# Patient Record
Sex: Female | Born: 1945 | Race: White | Hispanic: No | Marital: Married | Smoking: Never smoker
Health system: Southern US, Community
[De-identification: ages and names within clinical notes are randomized; demographics above are authoritative.]

## PROBLEM LIST (undated history)

## (undated) DIAGNOSIS — T884XXA Failed or difficult intubation, initial encounter: Secondary | ICD-10-CM

## (undated) DIAGNOSIS — K449 Diaphragmatic hernia without obstruction or gangrene: Secondary | ICD-10-CM

## (undated) DIAGNOSIS — E782 Mixed hyperlipidemia: Secondary | ICD-10-CM

## (undated) DIAGNOSIS — R55 Syncope and collapse: Secondary | ICD-10-CM

## (undated) DIAGNOSIS — H9190 Unspecified hearing loss, unspecified ear: Secondary | ICD-10-CM

## (undated) DIAGNOSIS — E538 Deficiency of other specified B group vitamins: Secondary | ICD-10-CM

## (undated) DIAGNOSIS — D649 Anemia, unspecified: Secondary | ICD-10-CM

## (undated) DIAGNOSIS — I639 Cerebral infarction, unspecified: Secondary | ICD-10-CM

## (undated) DIAGNOSIS — K589 Irritable bowel syndrome without diarrhea: Secondary | ICD-10-CM

## (undated) DIAGNOSIS — M1990 Unspecified osteoarthritis, unspecified site: Secondary | ICD-10-CM

## (undated) DIAGNOSIS — I38 Endocarditis, valve unspecified: Secondary | ICD-10-CM

## (undated) DIAGNOSIS — E119 Type 2 diabetes mellitus without complications: Secondary | ICD-10-CM

## (undated) DIAGNOSIS — I340 Nonrheumatic mitral (valve) insufficiency: Secondary | ICD-10-CM

## (undated) DIAGNOSIS — I517 Cardiomegaly: Secondary | ICD-10-CM

## (undated) DIAGNOSIS — I10 Essential (primary) hypertension: Secondary | ICD-10-CM

## (undated) DIAGNOSIS — K219 Gastro-esophageal reflux disease without esophagitis: Secondary | ICD-10-CM

## (undated) DIAGNOSIS — H3530 Unspecified macular degeneration: Secondary | ICD-10-CM

## (undated) DIAGNOSIS — G629 Polyneuropathy, unspecified: Secondary | ICD-10-CM

## (undated) DIAGNOSIS — R197 Diarrhea, unspecified: Secondary | ICD-10-CM

## (undated) DIAGNOSIS — E049 Nontoxic goiter, unspecified: Secondary | ICD-10-CM

## (undated) DIAGNOSIS — K121 Other forms of stomatitis: Secondary | ICD-10-CM

## (undated) DIAGNOSIS — G5600 Carpal tunnel syndrome, unspecified upper limb: Secondary | ICD-10-CM

## (undated) HISTORY — PX: TONSILLECTOMY: SUR1361

## (undated) HISTORY — PX: OTHER SURGICAL HISTORY: SHX169

## (undated) HISTORY — PX: ABDOMINAL HYSTERECTOMY: SHX81

## (undated) HISTORY — PX: JOINT REPLACEMENT: SHX530

## (undated) HISTORY — PX: APPENDECTOMY: SHX54

## (undated) HISTORY — PX: THYROIDECTOMY: SHX17

## (undated) HISTORY — PX: EYE SURGERY: SHX253

## (undated) HISTORY — PX: CATARACT EXTRACTION: SUR2

## (undated) HISTORY — PX: SHOULDER SURGERY: SHX246

---

## 2005-01-19 ENCOUNTER — Ambulatory Visit: Payer: Self-pay | Admitting: Family Medicine

## 2005-08-20 ENCOUNTER — Ambulatory Visit: Payer: Self-pay | Admitting: Unknown Physician Specialty

## 2006-02-01 ENCOUNTER — Ambulatory Visit: Payer: Self-pay | Admitting: Unknown Physician Specialty

## 2006-05-23 ENCOUNTER — Ambulatory Visit: Payer: Self-pay

## 2006-06-13 ENCOUNTER — Ambulatory Visit: Payer: Self-pay | Admitting: Unknown Physician Specialty

## 2006-08-15 ENCOUNTER — Ambulatory Visit: Payer: Self-pay | Admitting: General Practice

## 2006-09-26 ENCOUNTER — Ambulatory Visit: Payer: Self-pay | Admitting: General Practice

## 2007-02-08 ENCOUNTER — Ambulatory Visit: Payer: Self-pay | Admitting: Unknown Physician Specialty

## 2007-07-27 ENCOUNTER — Ambulatory Visit: Payer: Self-pay | Admitting: General Practice

## 2007-09-12 ENCOUNTER — Ambulatory Visit: Payer: Self-pay | Admitting: General Practice

## 2007-09-12 ENCOUNTER — Other Ambulatory Visit: Payer: Self-pay

## 2007-09-18 ENCOUNTER — Ambulatory Visit: Payer: Self-pay | Admitting: General Practice

## 2007-11-30 HISTORY — PX: OTHER SURGICAL HISTORY: SHX169

## 2007-12-25 ENCOUNTER — Ambulatory Visit: Payer: Self-pay | Admitting: General Practice

## 2008-01-08 ENCOUNTER — Inpatient Hospital Stay: Payer: Self-pay | Admitting: General Practice

## 2008-02-27 ENCOUNTER — Ambulatory Visit: Payer: Self-pay | Admitting: Unknown Physician Specialty

## 2008-07-03 ENCOUNTER — Ambulatory Visit: Payer: Self-pay | Admitting: Unknown Physician Specialty

## 2009-03-03 ENCOUNTER — Ambulatory Visit: Payer: Self-pay | Admitting: Unknown Physician Specialty

## 2009-07-23 ENCOUNTER — Ambulatory Visit: Payer: Self-pay | Admitting: Unknown Physician Specialty

## 2009-12-26 ENCOUNTER — Ambulatory Visit: Payer: Self-pay | Admitting: Unknown Physician Specialty

## 2010-03-05 ENCOUNTER — Ambulatory Visit: Payer: Self-pay | Admitting: Unknown Physician Specialty

## 2011-01-27 ENCOUNTER — Ambulatory Visit: Payer: Self-pay | Admitting: General Practice

## 2011-03-08 ENCOUNTER — Ambulatory Visit: Payer: Self-pay | Admitting: Unknown Physician Specialty

## 2011-06-17 ENCOUNTER — Emergency Department: Payer: Self-pay | Admitting: Emergency Medicine

## 2012-03-29 ENCOUNTER — Ambulatory Visit: Payer: Self-pay | Admitting: Unknown Physician Specialty

## 2013-04-11 ENCOUNTER — Ambulatory Visit: Payer: Self-pay | Admitting: Unknown Physician Specialty

## 2014-05-21 ENCOUNTER — Ambulatory Visit: Payer: Self-pay | Admitting: Physician Assistant

## 2014-09-01 ENCOUNTER — Emergency Department: Payer: Self-pay | Admitting: Internal Medicine

## 2014-09-01 LAB — BASIC METABOLIC PANEL
ANION GAP: 11 (ref 7–16)
BUN: 18 mg/dL (ref 7–18)
CO2: 25 mmol/L (ref 21–32)
Calcium, Total: 8.9 mg/dL (ref 8.5–10.1)
Chloride: 97 mmol/L — ABNORMAL LOW (ref 98–107)
Creatinine: 0.91 mg/dL (ref 0.60–1.30)
EGFR (African American): >60
Glucose: 178 mg/dL — ABNORMAL HIGH (ref 65–99)
OSMOLALITY: 273 (ref 275–301)
Potassium: 3.9 mmol/L (ref 3.5–5.1)
Sodium: 133 mmol/L — ABNORMAL LOW (ref 136–145)

## 2014-09-01 LAB — CBC
HCT: 38.2 % (ref 35.0–47.0)
HGB: 12.7 g/dL (ref 12.0–16.0)
MCH: 29.6 pg (ref 26.0–34.0)
MCHC: 33.4 g/dL (ref 32.0–36.0)
MCV: 89 fL (ref 80–100)
Platelet: 304 10*3/uL (ref 150–440)
RBC: 4.3 10*6/uL (ref 3.80–5.20)
RDW: 13.8 % (ref 11.5–14.5)
WBC: 7.2 10*3/uL (ref 3.6–11.0)

## 2014-09-01 LAB — TROPONIN I

## 2015-09-18 ENCOUNTER — Other Ambulatory Visit: Payer: Self-pay | Admitting: Neurology

## 2015-09-18 DIAGNOSIS — R42 Dizziness and giddiness: Secondary | ICD-10-CM

## 2015-09-18 DIAGNOSIS — R55 Syncope and collapse: Secondary | ICD-10-CM

## 2015-09-29 ENCOUNTER — Ambulatory Visit: Payer: Commercial Managed Care - HMO

## 2015-10-16 ENCOUNTER — Ambulatory Visit
Admission: RE | Admit: 2015-10-16 | Discharge: 2015-10-16 | Disposition: A | Discharge status: Home / Self Care | Payer: Commercial Managed Care - HMO | Source: Ambulatory Visit | Attending: Neurology | Admitting: Neurology

## 2015-10-16 DIAGNOSIS — G319 Degenerative disease of nervous system, unspecified: Secondary | ICD-10-CM | POA: Insufficient documentation

## 2015-10-16 DIAGNOSIS — R55 Syncope and collapse: Secondary | ICD-10-CM | POA: Diagnosis present

## 2015-10-16 DIAGNOSIS — R42 Dizziness and giddiness: Secondary | ICD-10-CM | POA: Diagnosis present

## 2015-10-16 DIAGNOSIS — I639 Cerebral infarction, unspecified: Secondary | ICD-10-CM | POA: Diagnosis not present

## 2015-10-16 MED ORDER — GADOBENATE DIMEGLUMINE 529 MG/ML IV SOLN
20.0000 mL | Freq: Once | INTRAVENOUS | Status: AC | PRN
  Administered 2015-10-16: 16 mL via INTRAVENOUS

## 2015-11-11 ENCOUNTER — Other Ambulatory Visit: Payer: Self-pay | Admitting: Physician Assistant

## 2015-11-11 DIAGNOSIS — Z1231 Encounter for screening mammogram for malignant neoplasm of breast: Secondary | ICD-10-CM

## 2015-11-19 ENCOUNTER — Other Ambulatory Visit: Payer: Self-pay | Admitting: Physician Assistant

## 2015-11-19 ENCOUNTER — Ambulatory Visit
Admission: RE | Admit: 2015-11-19 | Discharge: 2015-11-19 | Disposition: A | Discharge status: Home / Self Care | Payer: Commercial Managed Care - HMO | Source: Ambulatory Visit | Attending: Physician Assistant | Admitting: Physician Assistant

## 2015-11-19 DIAGNOSIS — Z1231 Encounter for screening mammogram for malignant neoplasm of breast: Secondary | ICD-10-CM | POA: Diagnosis not present

## 2015-12-16 DIAGNOSIS — Z1211 Encounter for screening for malignant neoplasm of colon: Secondary | ICD-10-CM | POA: Diagnosis not present

## 2015-12-19 DIAGNOSIS — Z1211 Encounter for screening for malignant neoplasm of colon: Secondary | ICD-10-CM | POA: Diagnosis not present

## 2015-12-19 DIAGNOSIS — Z1212 Encounter for screening for malignant neoplasm of rectum: Secondary | ICD-10-CM | POA: Diagnosis not present

## 2015-12-25 DIAGNOSIS — G8929 Other chronic pain: Secondary | ICD-10-CM | POA: Diagnosis not present

## 2015-12-25 DIAGNOSIS — M1711 Unilateral primary osteoarthritis, right knee: Secondary | ICD-10-CM | POA: Diagnosis not present

## 2015-12-25 DIAGNOSIS — M25561 Pain in right knee: Secondary | ICD-10-CM | POA: Diagnosis not present

## 2015-12-25 DIAGNOSIS — Z96652 Presence of left artificial knee joint: Secondary | ICD-10-CM | POA: Diagnosis not present

## 2015-12-30 DIAGNOSIS — E538 Deficiency of other specified B group vitamins: Secondary | ICD-10-CM | POA: Diagnosis not present

## 2015-12-30 DIAGNOSIS — D510 Vitamin B12 deficiency anemia due to intrinsic factor deficiency: Secondary | ICD-10-CM | POA: Diagnosis not present

## 2016-01-07 DIAGNOSIS — G464 Cerebellar stroke syndrome: Secondary | ICD-10-CM | POA: Diagnosis not present

## 2016-01-07 DIAGNOSIS — G319 Degenerative disease of nervous system, unspecified: Secondary | ICD-10-CM | POA: Diagnosis not present

## 2016-01-07 DIAGNOSIS — R42 Dizziness and giddiness: Secondary | ICD-10-CM | POA: Diagnosis not present

## 2016-01-08 ENCOUNTER — Other Ambulatory Visit: Payer: Self-pay

## 2016-01-08 ENCOUNTER — Encounter
Admission: RE | Admit: 2016-01-08 | Discharge: 2016-01-08 | Disposition: A | Discharge status: Home / Self Care | Payer: PPO | Source: Ambulatory Visit | Attending: Orthopedic Surgery | Admitting: Orthopedic Surgery

## 2016-01-08 DIAGNOSIS — G8929 Other chronic pain: Secondary | ICD-10-CM | POA: Diagnosis not present

## 2016-01-08 DIAGNOSIS — Z7984 Long term (current) use of oral hypoglycemic drugs: Secondary | ICD-10-CM | POA: Diagnosis not present

## 2016-01-08 DIAGNOSIS — Z7982 Long term (current) use of aspirin: Secondary | ICD-10-CM | POA: Diagnosis not present

## 2016-01-08 DIAGNOSIS — M1711 Unilateral primary osteoarthritis, right knee: Secondary | ICD-10-CM | POA: Diagnosis not present

## 2016-01-08 DIAGNOSIS — Z01812 Encounter for preprocedural laboratory examination: Secondary | ICD-10-CM | POA: Diagnosis not present

## 2016-01-08 DIAGNOSIS — M25561 Pain in right knee: Secondary | ICD-10-CM | POA: Insufficient documentation

## 2016-01-08 DIAGNOSIS — Z79899 Other long term (current) drug therapy: Secondary | ICD-10-CM | POA: Diagnosis not present

## 2016-01-08 DIAGNOSIS — H353211 Exudative age-related macular degeneration, right eye, with active choroidal neovascularization: Secondary | ICD-10-CM | POA: Diagnosis not present

## 2016-01-08 DIAGNOSIS — Z96651 Presence of right artificial knee joint: Secondary | ICD-10-CM | POA: Diagnosis not present

## 2016-01-08 DIAGNOSIS — Z471 Aftercare following joint replacement surgery: Secondary | ICD-10-CM | POA: Diagnosis not present

## 2016-01-08 DIAGNOSIS — E119 Type 2 diabetes mellitus without complications: Secondary | ICD-10-CM | POA: Diagnosis not present

## 2016-01-08 DIAGNOSIS — H353122 Nonexudative age-related macular degeneration, left eye, intermediate dry stage: Secondary | ICD-10-CM | POA: Diagnosis not present

## 2016-01-08 DIAGNOSIS — M179 Osteoarthritis of knee, unspecified: Secondary | ICD-10-CM | POA: Diagnosis not present

## 2016-01-08 DIAGNOSIS — D519 Vitamin B12 deficiency anemia, unspecified: Secondary | ICD-10-CM | POA: Diagnosis not present

## 2016-01-08 DIAGNOSIS — I10 Essential (primary) hypertension: Secondary | ICD-10-CM | POA: Diagnosis not present

## 2016-01-08 HISTORY — DX: Failed or difficult intubation, initial encounter: T88.4XXA

## 2016-01-08 LAB — CBC
HCT: 33.4 % — ABNORMAL LOW (ref 35.0–47.0)
Hemoglobin: 11.1 g/dL — ABNORMAL LOW (ref 12.0–16.0)
MCH: 26.5 pg (ref 26.0–34.0)
MCHC: 33.3 g/dL (ref 32.0–36.0)
MCV: 79.6 fL — ABNORMAL LOW (ref 80.0–100.0)
PLATELETS: 345 10*3/uL (ref 150–440)
RBC: 4.2 MIL/uL (ref 3.80–5.20)
RDW: 14.8 % — AB (ref 11.5–14.5)
WBC: 6.8 10*3/uL (ref 3.6–11.0)

## 2016-01-08 LAB — HEMOGLOBIN A1C: Hgb A1c MFr Bld: 6.8 % — ABNORMAL HIGH (ref 4.0–6.0)

## 2016-01-08 LAB — URINALYSIS COMPLETE WITH MICROSCOPIC (ARMC ONLY)
Bilirubin Urine: NEGATIVE
GLUCOSE, UA: NEGATIVE mg/dL
HGB URINE DIPSTICK: NEGATIVE
Ketones, ur: NEGATIVE mg/dL
LEUKOCYTES UA: NEGATIVE
NITRITE: NEGATIVE
PH: 6 (ref 5.0–8.0)
PROTEIN: NEGATIVE mg/dL
SPECIFIC GRAVITY, URINE: 1.011 (ref 1.005–1.030)
WBC UA: NONE SEEN WBC/hpf (ref 0–5)

## 2016-01-08 LAB — BASIC METABOLIC PANEL
ANION GAP: 13 (ref 5–15)
BUN: 22 mg/dL — ABNORMAL HIGH (ref 6–20)
CALCIUM: 10 mg/dL (ref 8.9–10.3)
CO2: 26 mmol/L (ref 22–32)
Chloride: 95 mmol/L — ABNORMAL LOW (ref 101–111)
Creatinine, Ser: 0.66 mg/dL (ref 0.44–1.00)
Glucose, Bld: 107 mg/dL — ABNORMAL HIGH (ref 65–99)
POTASSIUM: 4 mmol/L (ref 3.5–5.1)
Sodium: 134 mmol/L — ABNORMAL LOW (ref 135–145)

## 2016-01-08 LAB — TYPE AND SCREEN
ABO/RH(D): B POS
ANTIBODY SCREEN: NEGATIVE

## 2016-01-08 LAB — SURGICAL PCR SCREEN
MRSA, PCR: NEGATIVE
Staphylococcus aureus: POSITIVE — AB

## 2016-01-08 LAB — PROTIME-INR
INR: 0.99
Prothrombin Time: 13.3 seconds (ref 11.4–15.0)

## 2016-01-08 LAB — ABO/RH: ABO/RH(D): B POS

## 2016-01-08 LAB — SEDIMENTATION RATE: Sed Rate: 14 mm/hr (ref 0–30)

## 2016-01-08 LAB — APTT: aPTT: 29 seconds (ref 24–36)

## 2016-01-08 NOTE — Patient Instructions (Addendum)
  Your procedure is scheduled on: 01/19/16 Report to Day Surgery. To find out your arrival time please call [phone] between 1PM - 3PM on 01/16/16  Remember: Instructions that are not followed completely may result in serious medical risk, up to and including death, or upon the discretion of your surgeon and anesthesiologist your surgery may need to be rescheduled.    ____ 1. Do not eat food or drink liquids after midnight. No gum chewing or hard candies.     ____ 2. No Alcohol for 24 hours before or after surgery.   ____ 3. Bring all medications with you on the day of surgery if instructed.    ____ 4. Notify your doctor if there is any change in your medical condition     (cold, fever, infections).     Do not wear jewelry, make-up, hairpins, clips or nail polish.  Do not wear lotions, powders, or perfumes. You may wear deodorant.  Do not shave 48 hours prior to surgery. Men may shave face and neck.  Do not bring valuables to the hospital.    [facility] is not responsible for any belongings or valuables.               Contacts, dentures or bridgework may not be worn into surgery.  Leave your suitcase in the car. After surgery it may be brought to your room.  For patients admitted to the hospital, discharge time is determined by your                treatment team.   Patients discharged the day of surgery will not be allowed to drive home.   Please read over the following fact sheets that you were given:   MRSA Information and Surgical Site Infection Prevention   __x__ Take these medicines the morning of surgery with A SIP OF WATER:    1. Omeprazole the night before surgery and morning of surgery                 lisinopril  2.   3.   4.  5.  6.  ____ Fleet Enema (as directed)   __x__ Use CHG Soap as directed  ____ Use inhalers on the day of surgery  _x___ Stop metformin 2 days prior to surgery 2/18    ____ Take 1/2 of usual insulin dose the night before surgery and  none on the morning of surgery.   ____ Stop aspirin on 2/13  ____ Stop Anti-inflammatories on 01/12/16 May take Tylenol only   __x__ Stop supplements until after surgery.  Vit E, Vit C, and Biotin,PreserVision, ( May take multi vitamin and Calcium)  ____ Bring C-Pap to the hospital.

## 2016-01-09 NOTE — Pre-Procedure Instructions (Signed)
Lab (met B, CBC, A1C, urine, and positive staph aureus) results sent to Anesthesia and Dr. Marry Guan for review.  Asked Dr. Marry Guan if wanted any treatment for positive staph aureus.

## 2016-01-10 LAB — URINE CULTURE: Special Requests: NORMAL

## 2016-01-13 NOTE — Pre-Procedure Instructions (Signed)
EKG NOTED. 

## 2016-01-13 NOTE — Pre-Procedure Instructions (Signed)
EKG sent to Anesthesia and Dr. Marry Guan for review.

## 2016-01-15 DIAGNOSIS — H353122 Nonexudative age-related macular degeneration, left eye, intermediate dry stage: Secondary | ICD-10-CM | POA: Diagnosis not present

## 2016-01-15 DIAGNOSIS — H353211 Exudative age-related macular degeneration, right eye, with active choroidal neovascularization: Secondary | ICD-10-CM | POA: Diagnosis not present

## 2016-01-19 ENCOUNTER — Encounter: Payer: Self-pay | Admitting: Orthopedic Surgery

## 2016-01-19 ENCOUNTER — Inpatient Hospital Stay
Admission: RE | Admit: 2016-01-19 | Discharge: 2016-01-21 | DRG: 470 | Disposition: A | Discharge status: Home Health | Payer: PPO | Source: Ambulatory Visit | Attending: Orthopedic Surgery | Admitting: Orthopedic Surgery

## 2016-01-19 ENCOUNTER — Inpatient Hospital Stay: Payer: PPO

## 2016-01-19 ENCOUNTER — Inpatient Hospital Stay: Payer: PPO | Admitting: Anesthesiology

## 2016-01-19 ENCOUNTER — Encounter
Admission: RE | Disposition: A | Discharge status: Home Health | Payer: Self-pay | Source: Ambulatory Visit | Attending: Orthopedic Surgery

## 2016-01-19 DIAGNOSIS — G8929 Other chronic pain: Secondary | ICD-10-CM | POA: Diagnosis not present

## 2016-01-19 DIAGNOSIS — Z79899 Other long term (current) drug therapy: Secondary | ICD-10-CM

## 2016-01-19 DIAGNOSIS — M1711 Unilateral primary osteoarthritis, right knee: Principal | ICD-10-CM | POA: Diagnosis present

## 2016-01-19 DIAGNOSIS — Z471 Aftercare following joint replacement surgery: Secondary | ICD-10-CM | POA: Diagnosis not present

## 2016-01-19 DIAGNOSIS — E119 Type 2 diabetes mellitus without complications: Secondary | ICD-10-CM | POA: Diagnosis present

## 2016-01-19 DIAGNOSIS — I10 Essential (primary) hypertension: Secondary | ICD-10-CM | POA: Diagnosis not present

## 2016-01-19 DIAGNOSIS — D519 Vitamin B12 deficiency anemia, unspecified: Secondary | ICD-10-CM | POA: Diagnosis present

## 2016-01-19 DIAGNOSIS — H353122 Nonexudative age-related macular degeneration, left eye, intermediate dry stage: Secondary | ICD-10-CM | POA: Diagnosis not present

## 2016-01-19 DIAGNOSIS — Z96651 Presence of right artificial knee joint: Secondary | ICD-10-CM | POA: Diagnosis not present

## 2016-01-19 DIAGNOSIS — Z7984 Long term (current) use of oral hypoglycemic drugs: Secondary | ICD-10-CM

## 2016-01-19 DIAGNOSIS — M25561 Pain in right knee: Secondary | ICD-10-CM | POA: Diagnosis not present

## 2016-01-19 DIAGNOSIS — Z7982 Long term (current) use of aspirin: Secondary | ICD-10-CM

## 2016-01-19 DIAGNOSIS — H353211 Exudative age-related macular degeneration, right eye, with active choroidal neovascularization: Secondary | ICD-10-CM | POA: Diagnosis not present

## 2016-01-19 DIAGNOSIS — Z01812 Encounter for preprocedural laboratory examination: Secondary | ICD-10-CM | POA: Diagnosis not present

## 2016-01-19 DIAGNOSIS — M179 Osteoarthritis of knee, unspecified: Secondary | ICD-10-CM | POA: Diagnosis not present

## 2016-01-19 DIAGNOSIS — Z96659 Presence of unspecified artificial knee joint: Secondary | ICD-10-CM

## 2016-01-19 HISTORY — DX: Type 2 diabetes mellitus without complications: E11.9

## 2016-01-19 HISTORY — PX: KNEE ARTHROPLASTY: SHX992

## 2016-01-19 LAB — GLUCOSE, CAPILLARY
Glucose-Capillary: 170 mg/dL — ABNORMAL HIGH (ref 65–99)
Glucose-Capillary: 170 mg/dL — ABNORMAL HIGH (ref 65–99)

## 2016-01-19 SURGERY — ARTHROPLASTY, KNEE, TOTAL, USING IMAGELESS COMPUTER-ASSISTED NAVIGATION
Anesthesia: Spinal | Laterality: Right | Wound class: Clean

## 2016-01-19 MED ORDER — BUPIVACAINE-EPINEPHRINE (PF) 0.25% -1:200000 IJ SOLN
INTRAMUSCULAR | Status: AC
  Filled 2016-01-19: qty 30

## 2016-01-19 MED ORDER — FENTANYL CITRATE (PF) 100 MCG/2ML IJ SOLN
INTRAMUSCULAR | Status: AC
  Administered 2016-01-19: 25 ug via INTRAVENOUS
  Filled 2016-01-19: qty 2

## 2016-01-19 MED ORDER — ONDANSETRON HCL 4 MG/2ML IJ SOLN
INTRAMUSCULAR | Status: AC
  Administered 2016-01-19: 4 mg via INTRAVENOUS
  Filled 2016-01-19: qty 2

## 2016-01-19 MED ORDER — NEOMYCIN-POLYMYXIN B GU 40-200000 IR SOLN
Status: AC
  Filled 2016-01-19: qty 20

## 2016-01-19 MED ORDER — BISACODYL 10 MG RE SUPP
10.0000 mg | Freq: Every day | RECTAL | Status: DC | PRN
  Administered 2016-01-21: 10 mg via RECTAL
  Filled 2016-01-19: qty 1

## 2016-01-19 MED ORDER — BUPIVACAINE IN DEXTROSE 0.75-8.25 % IT SOLN
INTRATHECAL | Status: DC | PRN
  Administered 2016-01-19: 1.7 mL via INTRATHECAL

## 2016-01-19 MED ORDER — SODIUM CHLORIDE 0.9 % IJ SOLN
INTRAMUSCULAR | Status: AC
  Filled 2016-01-19: qty 50

## 2016-01-19 MED ORDER — ADULT MULTIVITAMIN W/MINERALS CH
1.0000 | ORAL_TABLET | Freq: Once | ORAL | Status: DC
  Filled 2016-01-19: qty 1

## 2016-01-19 MED ORDER — ENOXAPARIN SODIUM 30 MG/0.3ML ~~LOC~~ SOLN
30.0000 mg | Freq: Two times a day (BID) | SUBCUTANEOUS | Status: DC
  Administered 2016-01-20 – 2016-01-21 (×3): 30 mg via SUBCUTANEOUS
  Filled 2016-01-19 (×3): qty 0.3

## 2016-01-19 MED ORDER — SODIUM CHLORIDE 0.9 % IV SOLN
10000.0000 ug | INTRAVENOUS | Status: DC | PRN
  Administered 2016-01-19: 10 ug/min via INTRAVENOUS

## 2016-01-19 MED ORDER — SODIUM CHLORIDE 0.9 % IV SOLN
INTRAVENOUS | Status: DC
  Administered 2016-01-19: 10:00:00 via INTRAVENOUS

## 2016-01-19 MED ORDER — TETRACAINE HCL 1 % IJ SOLN
INTRAMUSCULAR | Status: AC
  Filled 2016-01-19: qty 2

## 2016-01-19 MED ORDER — CLINDAMYCIN PHOSPHATE 900 MG/50ML IV SOLN: 900.0000 mg | Freq: Once | INTRAVENOUS | Status: DC

## 2016-01-19 MED ORDER — METOCLOPRAMIDE HCL 10 MG PO TABS
10.0000 mg | ORAL_TABLET | Freq: Three times a day (TID) | ORAL | Status: DC
  Administered 2016-01-19 – 2016-01-21 (×7): 10 mg via ORAL
  Filled 2016-01-19 (×6): qty 1

## 2016-01-19 MED ORDER — MAGNESIUM HYDROXIDE 400 MG/5ML PO SUSP
30.0000 mL | Freq: Every day | ORAL | Status: DC | PRN
  Administered 2016-01-21: 30 mL via ORAL
  Filled 2016-01-19: qty 30

## 2016-01-19 MED ORDER — TRANEXAMIC ACID 1000 MG/10ML IV SOLN
1000.0000 mg | Freq: Once | INTRAVENOUS | Status: AC
  Administered 2016-01-19: 1000 mg via INTRAVENOUS
  Filled 2016-01-19: qty 10

## 2016-01-19 MED ORDER — DIPHENHYDRAMINE HCL 12.5 MG/5ML PO ELIX: 12.5000 mg | ORAL_SOLUTION | ORAL | Status: DC | PRN

## 2016-01-19 MED ORDER — FLEET ENEMA 7-19 GM/118ML RE ENEM: 1.0000 | ENEMA | Freq: Once | RECTAL | Status: DC | PRN

## 2016-01-19 MED ORDER — ACETAMINOPHEN 10 MG/ML IV SOLN
1000.0000 mg | Freq: Four times a day (QID) | INTRAVENOUS | Status: AC
  Administered 2016-01-19 – 2016-01-20 (×3): 1000 mg via INTRAVENOUS
  Filled 2016-01-19 (×4): qty 100

## 2016-01-19 MED ORDER — KETAMINE HCL 10 MG/ML IJ SOLN
INTRAMUSCULAR | Status: DC | PRN
  Administered 2016-01-19 (×5): 10 mg via INTRAVENOUS

## 2016-01-19 MED ORDER — ACETAMINOPHEN 10 MG/ML IV SOLN
INTRAVENOUS | Status: DC | PRN
  Administered 2016-01-19: 1000 mg via INTRAVENOUS

## 2016-01-19 MED ORDER — ONDANSETRON HCL 4 MG/2ML IJ SOLN
4.0000 mg | Freq: Once | INTRAMUSCULAR | Status: AC | PRN
Start: 2016-01-19 — End: 2016-01-19
  Administered 2016-01-19: 4 mg via INTRAVENOUS

## 2016-01-19 MED ORDER — PANTOPRAZOLE SODIUM 40 MG PO TBEC
40.0000 mg | DELAYED_RELEASE_TABLET | Freq: Two times a day (BID) | ORAL | Status: DC
  Administered 2016-01-19 – 2016-01-21 (×4): 40 mg via ORAL
  Filled 2016-01-19 (×4): qty 1

## 2016-01-19 MED ORDER — BIOTIN 10 MG PO TABS: 10.0000 mg | ORAL_TABLET | Freq: Once | ORAL | Status: DC

## 2016-01-19 MED ORDER — FERROUS SULFATE 325 (65 FE) MG PO TABS
325.0000 mg | ORAL_TABLET | Freq: Two times a day (BID) | ORAL | Status: DC
  Administered 2016-01-19 – 2016-01-21 (×4): 325 mg via ORAL
  Filled 2016-01-19 (×4): qty 1

## 2016-01-19 MED ORDER — METFORMIN HCL ER 500 MG PO TB24
1000.0000 mg | ORAL_TABLET | Freq: Every day | ORAL | Status: DC
  Administered 2016-01-20 – 2016-01-21 (×2): 1000 mg via ORAL
  Filled 2016-01-19 (×2): qty 2

## 2016-01-19 MED ORDER — FENTANYL CITRATE (PF) 100 MCG/2ML IJ SOLN
INTRAMUSCULAR | Status: DC | PRN
  Administered 2016-01-19 (×2): 50 ug via INTRAVENOUS

## 2016-01-19 MED ORDER — OXYCODONE HCL 5 MG PO TABS
5.0000 mg | ORAL_TABLET | ORAL | Status: DC | PRN
  Administered 2016-01-19 – 2016-01-20 (×3): 10 mg via ORAL
  Filled 2016-01-19: qty 1
  Filled 2016-01-19 (×3): qty 2

## 2016-01-19 MED ORDER — PROPOFOL 500 MG/50ML IV EMUL
INTRAVENOUS | Status: DC | PRN
  Administered 2016-01-19: 50 ug/kg/min via INTRAVENOUS

## 2016-01-19 MED ORDER — SODIUM CHLORIDE 0.9 % IV SOLN
INTRAVENOUS | Status: DC
  Administered 2016-01-19 – 2016-01-20 (×2): via INTRAVENOUS

## 2016-01-19 MED ORDER — TRANEXAMIC ACID 1000 MG/10ML IV SOLN
1000.0000 mg | INTRAVENOUS | Status: DC
  Filled 2016-01-19: qty 10

## 2016-01-19 MED ORDER — FENTANYL CITRATE (PF) 100 MCG/2ML IJ SOLN
25.0000 ug | INTRAMUSCULAR | Status: DC | PRN
  Administered 2016-01-19 (×4): 25 ug via INTRAVENOUS

## 2016-01-19 MED ORDER — ACETAMINOPHEN 10 MG/ML IV SOLN
INTRAVENOUS | Status: AC
  Filled 2016-01-19: qty 100

## 2016-01-19 MED ORDER — ONDANSETRON HCL 4 MG PO TABS
4.0000 mg | ORAL_TABLET | Freq: Four times a day (QID) | ORAL | Status: DC | PRN
  Administered 2016-01-20: 4 mg via ORAL
  Filled 2016-01-19: qty 1

## 2016-01-19 MED ORDER — LACTATED RINGERS IV SOLN
INTRAVENOUS | Status: DC | PRN
  Administered 2016-01-19 (×2): via INTRAVENOUS

## 2016-01-19 MED ORDER — METOCLOPRAMIDE HCL 10 MG PO TABS
10.0000 mg | ORAL_TABLET | Freq: Three times a day (TID) | ORAL | Status: DC
  Filled 2016-01-19: qty 1

## 2016-01-19 MED ORDER — VITAMIN E 180 MG (400 UNIT) PO CAPS
400.0000 [IU] | ORAL_CAPSULE | Freq: Every day | ORAL | Status: DC
  Administered 2016-01-20: 400 [IU] via ORAL
  Filled 2016-01-19 (×2): qty 1

## 2016-01-19 MED ORDER — BUPIVACAINE-EPINEPHRINE 0.25% -1:200000 IJ SOLN
INTRAMUSCULAR | Status: DC | PRN
  Administered 2016-01-19: 30 mL

## 2016-01-19 MED ORDER — SODIUM CHLORIDE 0.9 % IV SOLN
INTRAVENOUS | Status: DC | PRN
  Administered 2016-01-19: 60 mL

## 2016-01-19 MED ORDER — HYDROCHLOROTHIAZIDE 25 MG PO TABS
25.0000 mg | ORAL_TABLET | ORAL | Status: DC
  Administered 2016-01-20 – 2016-01-21 (×2): 25 mg via ORAL
  Filled 2016-01-19 (×2): qty 1

## 2016-01-19 MED ORDER — METFORMIN HCL 500 MG PO TABS
1000.0000 mg | ORAL_TABLET | Freq: Every evening | ORAL | Status: DC
  Administered 2016-01-19 – 2016-01-20 (×2): 1000 mg via ORAL
  Filled 2016-01-19 (×2): qty 2

## 2016-01-19 MED ORDER — GABAPENTIN 300 MG PO CAPS
300.0000 mg | ORAL_CAPSULE | Freq: Two times a day (BID) | ORAL | Status: DC
  Administered 2016-01-19 – 2016-01-21 (×4): 300 mg via ORAL
  Filled 2016-01-19 (×5): qty 1

## 2016-01-19 MED ORDER — CALCIUM CARBONATE-VITAMIN D 500-200 MG-UNIT PO TABS
1.0000 | ORAL_TABLET | Freq: Every day | ORAL | Status: DC
  Filled 2016-01-19 (×2): qty 1

## 2016-01-19 MED ORDER — ACETAMINOPHEN 650 MG RE SUPP: 650.0000 mg | Freq: Four times a day (QID) | RECTAL | Status: DC | PRN

## 2016-01-19 MED ORDER — CLINDAMYCIN PHOSPHATE 900 MG/50ML IV SOLN
INTRAVENOUS | Status: AC
  Administered 2016-01-19: 900 mg via INTRAVENOUS
  Filled 2016-01-19: qty 50

## 2016-01-19 MED ORDER — SODIUM CHLORIDE 0.9 % IV SOLN
1000.0000 mg | INTRAVENOUS | Status: DC | PRN
  Administered 2016-01-19: 1000 mg via INTRAVENOUS

## 2016-01-19 MED ORDER — TRAMADOL HCL 50 MG PO TABS
50.0000 mg | ORAL_TABLET | ORAL | Status: DC | PRN
  Administered 2016-01-21: 50 mg via ORAL
  Filled 2016-01-19: qty 1

## 2016-01-19 MED ORDER — NEOMYCIN-POLYMYXIN B GU 40-200000 IR SOLN
Status: DC | PRN
  Administered 2016-01-19: 12 mL

## 2016-01-19 MED ORDER — ALUM & MAG HYDROXIDE-SIMETH 200-200-20 MG/5ML PO SUSP: 30.0000 mL | ORAL | Status: DC | PRN

## 2016-01-19 MED ORDER — MENTHOL 3 MG MT LOZG
1.0000 | LOZENGE | OROMUCOSAL | Status: DC | PRN
  Filled 2016-01-19: qty 9

## 2016-01-19 MED ORDER — MORPHINE SULFATE (PF) 2 MG/ML IV SOLN
2.0000 mg | INTRAVENOUS | Status: DC | PRN
  Administered 2016-01-19: 2 mg via INTRAVENOUS
  Filled 2016-01-19: qty 1

## 2016-01-19 MED ORDER — CEFAZOLIN SODIUM-DEXTROSE 2-3 GM-% IV SOLR
2.0000 g | Freq: Four times a day (QID) | INTRAVENOUS | Status: AC
  Administered 2016-01-19 – 2016-01-20 (×4): 2 g via INTRAVENOUS
  Filled 2016-01-19 (×4): qty 50

## 2016-01-19 MED ORDER — SENNOSIDES-DOCUSATE SODIUM 8.6-50 MG PO TABS
1.0000 | ORAL_TABLET | Freq: Two times a day (BID) | ORAL | Status: DC
  Administered 2016-01-19 – 2016-01-21 (×4): 1 via ORAL
  Filled 2016-01-19 (×4): qty 1

## 2016-01-19 MED ORDER — ONDANSETRON HCL 4 MG/2ML IJ SOLN: 4.0000 mg | Freq: Four times a day (QID) | INTRAMUSCULAR | Status: DC | PRN

## 2016-01-19 MED ORDER — BUPIVACAINE LIPOSOME 1.3 % IJ SUSP
INTRAMUSCULAR | Status: AC
  Filled 2016-01-19: qty 20

## 2016-01-19 MED ORDER — MIDAZOLAM HCL 5 MG/5ML IJ SOLN
INTRAMUSCULAR | Status: DC | PRN
  Administered 2016-01-19 (×2): 1 mg via INTRAVENOUS

## 2016-01-19 MED ORDER — VITAMIN C 500 MG PO TABS
500.0000 mg | ORAL_TABLET | Freq: Every day | ORAL | Status: DC
  Administered 2016-01-20: 500 mg via ORAL
  Filled 2016-01-19 (×2): qty 1

## 2016-01-19 MED ORDER — LISINOPRIL 10 MG PO TABS
10.0000 mg | ORAL_TABLET | ORAL | Status: DC
  Administered 2016-01-20 – 2016-01-21 (×2): 10 mg via ORAL
  Filled 2016-01-19 (×2): qty 1

## 2016-01-19 MED ORDER — GLIMEPIRIDE 2 MG PO TABS
1.0000 mg | ORAL_TABLET | Freq: Every day | ORAL | Status: DC
  Administered 2016-01-20: 1 mg via ORAL
  Filled 2016-01-19: qty 1

## 2016-01-19 MED ORDER — ACETAMINOPHEN 325 MG PO TABS
650.0000 mg | ORAL_TABLET | Freq: Four times a day (QID) | ORAL | Status: DC | PRN
  Administered 2016-01-20 (×3): 650 mg via ORAL
  Filled 2016-01-19 (×3): qty 2

## 2016-01-19 MED ORDER — OCUVITE PO TABS
1.0000 | ORAL_TABLET | Freq: Two times a day (BID) | ORAL | Status: DC
  Administered 2016-01-20: 1 via ORAL
  Filled 2016-01-19 (×5): qty 1

## 2016-01-19 MED ORDER — EPHEDRINE SULFATE 50 MG/ML IJ SOLN
INTRAMUSCULAR | Status: DC | PRN
  Administered 2016-01-19 (×2): 10 mg via INTRAVENOUS

## 2016-01-19 MED ORDER — INSULIN ASPART 100 UNIT/ML ~~LOC~~ SOLN
0.0000 [IU] | Freq: Three times a day (TID) | SUBCUTANEOUS | Status: DC
  Administered 2016-01-20: 2 [IU] via SUBCUTANEOUS
  Administered 2016-01-20: 3 [IU] via SUBCUTANEOUS
  Administered 2016-01-20 – 2016-01-21 (×2): 5 [IU] via SUBCUTANEOUS
  Filled 2016-01-19: qty 5
  Filled 2016-01-19: qty 3
  Filled 2016-01-19: qty 5
  Filled 2016-01-19: qty 2

## 2016-01-19 MED ORDER — PHENOL 1.4 % MT LIQD
1.0000 | OROMUCOSAL | Status: DC | PRN
  Filled 2016-01-19: qty 177

## 2016-01-19 SURGICAL SUPPLY — 57 items
AUTOTRANSFUS HAS 1/8 (MISCELLANEOUS) ×2
BATTERY INSTRU NAVIGATION (MISCELLANEOUS) ×8 IMPLANT
BLADE SAW 1 (BLADE) ×2 IMPLANT
BLADE SAW 1/2 (BLADE) ×2 IMPLANT
BONE CEMENT GENTAMICIN (Cement) ×4 IMPLANT
CANISTER SUCT 1200ML W/VALVE (MISCELLANEOUS) ×2 IMPLANT
CANISTER SUCT 3000ML (MISCELLANEOUS) ×4 IMPLANT
CAP KNEE TOTAL 3 SIGMA ×2 IMPLANT
CATH TRAY METER 16FR LF (MISCELLANEOUS) ×2 IMPLANT
CEMENT BONE GENTAMICIN 40 (Cement) ×2 IMPLANT
COOLER POLAR GLACIER W/PUMP (MISCELLANEOUS) ×2 IMPLANT
DRAPE SHEET LG 3/4 BI-LAMINATE (DRAPES) ×2 IMPLANT
DRSG DERMACEA 8X12 NADH (GAUZE/BANDAGES/DRESSINGS) ×2 IMPLANT
DRSG OPSITE POSTOP 4X14 (GAUZE/BANDAGES/DRESSINGS) ×2 IMPLANT
DRSG TEGADERM 4X4.75 (GAUZE/BANDAGES/DRESSINGS) ×2 IMPLANT
DURAPREP 26ML APPLICATOR (WOUND CARE) ×4 IMPLANT
ELECT CAUTERY BLADE 6.4 (BLADE) ×2 IMPLANT
ELECT REM PT RETURN 9FT ADLT (ELECTROSURGICAL) ×2
ELECTRODE REM PT RTRN 9FT ADLT (ELECTROSURGICAL) ×1 IMPLANT
EX-PIN ORTHOLOCK NAV 4X150 (PIN) ×4 IMPLANT
GLOVE BIOGEL M STRL SZ7.5 (GLOVE) ×4 IMPLANT
GLOVE INDICATOR 8.0 STRL GRN (GLOVE) ×2 IMPLANT
GLOVE SURG 9.0 ORTHO LTXF (GLOVE) ×2 IMPLANT
GLOVE SURG ORTHO 9.0 STRL STRW (GLOVE) ×2 IMPLANT
GOWN STRL REUS W/ TWL LRG LVL3 (GOWN DISPOSABLE) ×2 IMPLANT
GOWN STRL REUS W/TWL 2XL LVL3 (GOWN DISPOSABLE) ×2 IMPLANT
GOWN STRL REUS W/TWL LRG LVL3 (GOWN DISPOSABLE) ×2
HANDPIECE SUCTION TUBG SURGILV (MISCELLANEOUS) ×2 IMPLANT
HOLDER FOLEY CATH W/STRAP (MISCELLANEOUS) ×2 IMPLANT
HOOD PEEL AWAY FLYTE STAYCOOL (MISCELLANEOUS) ×4 IMPLANT
KIT RM TURNOVER STRD PROC AR (KITS) ×2 IMPLANT
KNIFE SCULPS 14X20 (INSTRUMENTS) ×2 IMPLANT
NDL SAFETY 18GX1.5 (NEEDLE) ×2 IMPLANT
NEEDLE SPNL 20GX3.5 QUINCKE YW (NEEDLE) ×2 IMPLANT
NS IRRIG 500ML POUR BTL (IV SOLUTION) ×2 IMPLANT
PACK TOTAL KNEE (MISCELLANEOUS) ×2 IMPLANT
PAD WRAPON POLAR KNEE (MISCELLANEOUS) ×1 IMPLANT
PIN DRILL QUICK PACK ×2 IMPLANT
PIN FIXATION 1/8DIA X 3INL (PIN) ×2 IMPLANT
SOL .9 NS 3000ML IRR  AL (IV SOLUTION) ×1
SOL .9 NS 3000ML IRR UROMATIC (IV SOLUTION) ×1 IMPLANT
SOL PREP PVP 2OZ (MISCELLANEOUS) ×2
SOLUTION PREP PVP 2OZ (MISCELLANEOUS) ×1 IMPLANT
SPONGE DRAIN TRACH 4X4 STRL 2S (GAUZE/BANDAGES/DRESSINGS) ×2 IMPLANT
STAPLER SKIN PROX 35W (STAPLE) ×2 IMPLANT
SUCTION FRAZIER HANDLE 10FR (MISCELLANEOUS) ×1
SUCTION TUBE FRAZIER 10FR DISP (MISCELLANEOUS) ×1 IMPLANT
SUT VIC AB 0 CT1 36 (SUTURE) ×2 IMPLANT
SUT VIC AB 1 CT1 36 (SUTURE) ×4 IMPLANT
SUT VIC AB 2-0 CT2 27 (SUTURE) ×2 IMPLANT
SYR 20CC LL (SYRINGE) ×2 IMPLANT
SYR 30ML LL (SYRINGE) ×2 IMPLANT
SYR 50ML LL SCALE MARK (SYRINGE) ×2 IMPLANT
SYSTEM AUTOTRANSFUS DUAL TROCR (MISCELLANEOUS) ×1 IMPLANT
TOWEL OR 17X26 4PK STRL BLUE (TOWEL DISPOSABLE) ×2 IMPLANT
TOWER CARTRIDGE SMART MIX (DISPOSABLE) ×2 IMPLANT
WRAPON POLAR PAD KNEE (MISCELLANEOUS) ×2

## 2016-01-19 NOTE — Anesthesia Preprocedure Evaluation (Signed)
Anesthesia Evaluation  Patient identified by MRN, date of birth, ID band Patient awake    Reviewed: Allergy & Precautions, NPO status , Patient's Chart, lab work & pertinent test results, reviewed documented beta blocker date and time   History of Anesthesia Complications (+) history of anesthetic complications  Airway Mallampati: III  TM Distance: >3 FB     Dental  (+) Chipped   Pulmonary           Cardiovascular      Neuro/Psych    GI/Hepatic   Endo/Other  diabetes  Renal/GU      Musculoskeletal   Abdominal   Peds  Hematology   Anesthesia Other Findings   Reproductive/Obstetrics                             Anesthesia Physical Anesthesia Plan  ASA: III  Anesthesia Plan: Spinal   Post-op Pain Management:    Induction:   Airway Management Planned:   Additional Equipment:   Intra-op Plan:   Post-operative Plan:   Informed Consent: I have reviewed the patients History and Physical, chart, labs and discussed the procedure including the risks, benefits and alternatives for the proposed anesthesia with the patient or authorized representative who has indicated his/her understanding and acceptance.     Plan Discussed with: CRNA  Anesthesia Plan Comments:         Anesthesia Quick Evaluation

## 2016-01-19 NOTE — Transfer of Care (Signed)
Immediate Anesthesia Transfer of Care Note  Patient: Laura Mcdaniel  Procedure(s) Performed: Procedure(s): COMPUTER ASSISTED TOTAL KNEE ARTHROPLASTY (Right)  Patient Location: PACU  Anesthesia Type:Spinal  Level of Consciousness: awake and alert   Airway & Oxygen Therapy: Patient Spontanous Breathing  Post-op Assessment: Report given to RN and Post -op Vital signs reviewed and stable  Post vital signs: Reviewed and stable  Last Vitals:  Filed Vitals:   01/19/16 0937 01/19/16 1545  BP: 134/72 129/72  Pulse: 83 88  Temp: 36.6 C 37 C  Resp: 18 13    Complications: No apparent anesthesia complications

## 2016-01-19 NOTE — Op Note (Signed)
OPERATIVE NOTE  DATE OF SURGERY:  01/19/2016  PATIENT NAME:  Laura Mcdaniel   DOB: Jun 19, 1946  MRN: [id number]  PRE-OPERATIVE DIAGNOSIS: Degenerative arthrosis of the right knee, primary  POST-OPERATIVE DIAGNOSIS:  Same  PROCEDURE:  Right total knee arthroplasty using computer-assisted navigation  SURGEON:  Marciano Sequin. M.D.  ASSISTANT:  Vance Peper, PA (present and scrubbed throughout the case, critical for assistance with exposure, retraction, instrumentation, and closure)  ANESTHESIA: spinal  ESTIMATED BLOOD LOSS: 50 mL  FLUIDS REPLACED: 2000 mL of crystalloid  TOURNIQUET TIME: 102 minutes  DRAINS: 2 medium drains to a reinfusion system  SOFT TISSUE RELEASES: Anterior cruciate ligament, posterior cruciate ligament, deep medial collateral ligament, patellofemoral ligament , and posterolateral corner  IMPLANTS UTILIZED: DePuy PFC Sigma size 3 posterior stabilized femoral component (cemented), size 3 MBT tibial component (cemented), 35 mm 3 peg oval dome patella (cemented), and a 10 mm stabilized rotating platform polyethylene insert.  INDICATIONS FOR SURGERY: Laura Mcdaniel is a 70 y.o. year old female with a long history of progressive knee pain. X-rays demonstrated severe degenerative changes in tricompartmental fashion. The patient had not seen any significant improvement despite conservative nonsurgical intervention. After discussion of the risks and benefits of surgical intervention, the patient expressed understanding of the risks benefits and agree with plans for total knee arthroplasty.   The risks, benefits, and alternatives were discussed at length including but not limited to the risks of infection, bleeding, nerve injury, stiffness, blood clots, the need for revision surgery, cardiopulmonary complications, among others, and they were willing to proceed.  PROCEDURE IN DETAIL: The patient was brought into the operating room and, after adequate spinal anesthesia was  achieved, a tourniquet was placed on the patient's upper thigh. The patient's knee and leg were cleaned and prepped with alcohol and DuraPrep and draped in the usual sterile fashion. A "timeout" was performed as per usual protocol. The lower extremity was exsanguinated using an Esmarch, and the tourniquet was inflated to 300 mmHg. An anterior longitudinal incision was made followed by a standard mid vastus approach. The deep fibers of the medial collateral ligament were elevated in a subperiosteal fashion off of the medial flare of the tibia so as to maintain a continuous soft tissue sleeve. The patella was subluxed laterally and the patellofemoral ligament was incised. Inspection of the knee demonstrated severe degenerative changes with full-thickness loss of articular cartilage. Osteophytes were debrided using a rongeur. Anterior and posterior cruciate ligaments were excised. Two 4.0 mm Schanz pins were inserted in the femur and into the tibia for attachment of the array of trackers used for computer-assisted navigation. Hip center was identified using a circumduction technique. Distal landmarks were mapped using the computer. The distal femur and proximal tibia were mapped using the computer. The distal femoral cutting guide was positioned using computer-assisted navigation so as to achieve a 5 distal valgus cut. The femur was sized and it was felt that a size 3 femoral component was appropriate. A size 3 femoral cutting guide was positioned and the anterior cut was performed and verified using the computer. This was followed by completion of the posterior and chamfer cuts. Femoral cutting guide for the central box was then positioned in the center box cut was performed.  Attention was then directed to the proximal tibia. Medial and lateral menisci were excised. The extramedullary tibial cutting guide was positioned using computer-assisted navigation so as to achieve a 0 varus-valgus alignment and 0  posterior slope. The cut was  performed and verified using the computer. The proximal tibia was sized and it was felt that a size 3 tibial tray was appropriate. Tibial and femoral trials were inserted followed by insertion of a 10 mm polyethylene insert. The knee was felt to be tight both in flexion and extension. Trial components removed and the proximal tibial cutting guide was positioned so as to resect an additional 2 mm of bone. Trial components were reinserted and a 10 mm polyethylene insert was placed. The knee was tight laterally. Trial components were removed and the knee placed in extension and distracted using the Moreland retractors. The posterolateral corner was carefully released using combination of electrocautery and Metzenbaum scissors. Trial components were reinserted and range of motion assessed. This allowed for excellent mediolateral soft tissue balancing both in flexion and in full extension. Finally, the patella was cut and prepared so as to accommodate a 35 mm 3 peg oval dome patella. A patella trial was placed and the knee was placed through a range of motion with excellent patellar tracking appreciated. The femoral trial was removed after debridement of posterior osteophytes. The central post-hole for the tibial component was reamed followed by insertion of a keel punch. Tibial trials were then removed. Cut surfaces of bone were irrigated with copious amounts of normal saline with antibiotic solution using pulsatile lavage and then suctioned dry. Polymethylmethacrylate cement with gentamicin was prepared in the usual fashion using a vacuum mixer. Cement was applied to the cut surface of the proximal tibia as well as along the undersurface of a size 3 MBT tibial component. Tibial component was positioned and impacted into place. Excess cement was removed using [employer] fast streamer. Cement was then applied to the cut surfaces of the femur as well as along the posterior flanges of the size 3 femoral  component. The femoral component was positioned and impacted into place. Excess cement was removed using [employer] fast streamer. A 10 mm polyethylene trial was inserted and the knee was brought into full extension with steady axial compression applied. Finally, cement was applied to the backside of a 35 mm 3 peg oval dome patella and the patellar component was positioned and patellar clamp applied. Excess cement was removed using [employer] fast streamer. After adequate curing of the cement, the tourniquet was deflated after a total tourniquet time of 102 minutes. Hemostasis was achieved using electrocautery. The knee was irrigated with copious amounts of normal saline with antibiotic solution using pulsatile lavage and then suctioned dry. 20 mL of 1.3% Exparel in 40 mL of normal saline was injected along the posterior capsule, medial and lateral gutters, and along the arthrotomy site. A 10 mm stabilized rotating platform polyethylene insert was inserted and the knee was placed through a range of motion with excellent mediolateral soft tissue balancing appreciated and excellent patellar tracking noted. 2 medium drains were placed in the wound bed and brought out through separate stab incisions to be attached to a reinfusion system. The medial parapatellar portion of the incision was reapproximated using interrupted sutures of #1 Vicryl. Subcutaneous tissue was then injected with a total of 30 cc of 0.25% Marcaine with epinephrine. Subcutaneous tissue was approximated in layers using first #0 Vicryl followed #2-0 Vicryl. The skin was approximated with skin staples. A sterile dressing was applied.  The patient tolerated the procedure well and was transported to the recovery room in stable condition.    James P. Holley Bouche., M.D.

## 2016-01-19 NOTE — Brief Op Note (Signed)
01/19/2016  3:46 PM  PATIENT:  Laura Mcdaniel  70 y.o. female  PRE-OPERATIVE DIAGNOSIS:  Degenerative arthrosis of the right knee  POST-OPERATIVE DIAGNOSIS:  degenerative arthrosis right knee  PROCEDURE:  Procedure(s): COMPUTER ASSISTED TOTAL KNEE ARTHROPLASTY (Right)  SURGEON:  Surgeon(s) and Role:    * Dereck Leep, MD - Primary  ASSISTANTS: Vance Peper, PA   ANESTHESIA:   spinal  EBL:  Total I/O In: 2000 [I.V.:2000] Out: 600 [Urine:550; Blood:50]  BLOOD ADMINISTERED:none  DRAINS: 2 medium drains to a reinfusion system   LOCAL MEDICATIONS USED:  MARCAINE    and OTHER Exparel  SPECIMEN:  No Specimen  DISPOSITION OF SPECIMEN:  N/A  COUNTS:  YES  TOURNIQUET:  102 minutes  DICTATION: .Dragon Dictation  PLAN OF CARE: Admit to inpatient   PATIENT DISPOSITION:  PACU - hemodynamically stable.   Delay start of Pharmacological VTE agent (>24hrs) due to surgical blood loss or risk of bleeding: yes

## 2016-01-19 NOTE — Anesthesia Procedure Notes (Signed)
Spinal Patient location during procedure: OR Staffing Anesthesiologist: Gunnar Bulla Performed by: anesthesiologist  Preanesthetic Checklist Completed: patient identified, site marked, surgical consent, pre-op evaluation, timeout performed, IV checked and risks and benefits discussed Spinal Block Patient position: sitting Prep: Betadine Patient monitoring: heart rate, cardiac monitor, continuous pulse ox and blood pressure Approach: midline Location: L3-4 Injection technique: single-shot Needle Needle type: Pencil-Tip  Needle gauge: 25 G Needle length: 9 cm Assessment Sensory level: T10 Additional Notes 1205 marcaine 1.36ml

## 2016-01-19 NOTE — H&P (Signed)
The patient has been re-examined, and the chart reviewed, and there have been no interval changes to the documented history and physical.    The risks, benefits, and alternatives have been discussed at length. The patient expressed understanding of the risks benefits and agreed with plans for surgical intervention.  Husayn Reim P. Yahsir Wickens, Jr. M.D.    

## 2016-01-19 NOTE — Progress Notes (Signed)
PHARMACIST - PHYSICIAN ORDER COMMUNICATION  CONCERNING: P&T Medication Policy on Herbal Medications  DESCRIPTION:  This patient's order for:  Biotin  has been noted.  This product(s) is classified as an "herbal" or natural product. Due to a lack of definitive safety studies or FDA approval, nonstandard manufacturing practices, plus the potential risk of unknown drug-drug interactions while on inpatient medications, the Pharmacy and Therapeutics Committee does not permit the use of "herbal" or natural products of this type within [facility].   ACTION TAKEN: The pharmacy department is unable to verify this order at this time and your patient has been informed of this safety policy. Please reevaluate patient's clinical condition at discharge and address if the herbal or natural product(s) should be resumed at that time.  Paulina Fusi, PharmD, BCPS 01/19/2016 5:25 PM

## 2016-01-20 LAB — CBC
HEMATOCRIT: 28.9 % — AB (ref 35.0–47.0)
HEMOGLOBIN: 9.7 g/dL — AB (ref 12.0–16.0)
MCH: 27 pg (ref 26.0–34.0)
MCHC: 33.5 g/dL (ref 32.0–36.0)
MCV: 80.6 fL (ref 80.0–100.0)
Platelets: 315 10*3/uL (ref 150–440)
RBC: 3.59 MIL/uL — ABNORMAL LOW (ref 3.80–5.20)
RDW: 14.9 % — ABNORMAL HIGH (ref 11.5–14.5)
WBC: 10.2 10*3/uL (ref 3.6–11.0)

## 2016-01-20 LAB — GLUCOSE, CAPILLARY
GLUCOSE-CAPILLARY: 186 mg/dL — AB (ref 65–99)
Glucose-Capillary: 182 mg/dL — ABNORMAL HIGH (ref 65–99)
Glucose-Capillary: 212 mg/dL — ABNORMAL HIGH (ref 65–99)
Glucose-Capillary: 134 mg/dL — ABNORMAL HIGH (ref 65–99)

## 2016-01-20 LAB — BASIC METABOLIC PANEL
Anion gap: 9 (ref 5–15)
BUN: 10 mg/dL (ref 6–20)
CHLORIDE: 96 mmol/L — AB (ref 101–111)
CO2: 26 mmol/L (ref 22–32)
CREATININE: 0.52 mg/dL (ref 0.44–1.00)
Calcium: 8.6 mg/dL — ABNORMAL LOW (ref 8.9–10.3)
GFR calc non Af Amer: >60 mL/min (ref >60)
Glucose, Bld: 196 mg/dL — ABNORMAL HIGH (ref 65–99)
Potassium: 3.6 mmol/L (ref 3.5–5.1)
Sodium: 131 mmol/L — ABNORMAL LOW (ref 135–145)

## 2016-01-20 MED ORDER — ENOXAPARIN SODIUM 40 MG/0.4ML ~~LOC~~ SOLN: 40.0000 mg | SUBCUTANEOUS | Status: DC

## 2016-01-20 MED ORDER — TRAMADOL HCL 50 MG PO TABS: 50.0000 mg | ORAL_TABLET | ORAL | Status: DC | PRN

## 2016-01-20 MED ORDER — OXYCODONE HCL 5 MG PO TABS: 5.0000 mg | ORAL_TABLET | ORAL | Status: DC | PRN

## 2016-01-20 NOTE — Care Management Note (Addendum)
Case Management Note  Patient Details  Name: Laura Mcdaniel MRN: [id number] Date of Birth: 1946-07-11  Subjective/Objective:      [name] Laura Mcdaniel received a right TKA by Dr Marry Guan on 01/19/16. Resides at home with her husband. PCP=Dr Emily Filbert. Pharmacy=[facility] on [address]. Lovenox 40mg  SQ x 14 days called to [facility].  Home assistive equipment includes a rolling walker and a bedside commode. Husband will transport to appointments. No home health services, no home oxygen. Laura Mcdaniel chose [facility] to be her home health provider. A referral for home health PT was called to Corliss Blacker at [facility]. Case management will follow for discharge planning.              Action/Plan:   Expected Discharge Date:                  Expected Discharge Plan:     In-House Referral:     Discharge planning Services     Post Acute Care Choice:    Choice offered to:     DME Arranged:    DME Agency:     HH Arranged:    Central Agency:     Status of Service:     Medicare Important Message Given:    Date Medicare IM Given:    Medicare IM give by:    Date Additional Medicare IM Given:    Additional Medicare Important Message give by:     If discussed at [place] of Stay Meetings, dates discussed:    Additional Comments:  Stephanie Mcglone A, RN 01/20/2016, 11:09 AM

## 2016-01-20 NOTE — Progress Notes (Signed)
Patient resting comfortably. Dangled legs off side of bed; per MD order. Patient tolerated well

## 2016-01-20 NOTE — Progress Notes (Signed)
Clinical Social Worker (CSW) received SNF consult. PT is recommending home health. RN Case Manager is aware of above. Please reconsult if future social work needs arise. CSW signing off.   Abryana Lykens Morgan, LCSW [phone] 

## 2016-01-20 NOTE — Evaluation (Signed)
Physical Therapy Evaluation Patient Details Name: Laura Mcdaniel MRN: [id number] DOB: 1946/03/14 Today's Date: 01/20/2016   History of Present Illness  70 yo F s/p R TKR on 01/19/16 after unsuccessful conservative management.  Clinical Impression  Pt presents s/p R TKR with deficits of ROM, strength, and functional mobility. She is limited by pain and nausea during this session and increased time was required to complete tasks. Pt's AAROM of R knee is 8 to 71 degrees. She is able to perform transfers and ambulation of 8 ft with FWW and min guard. Ambulation limited this session due to nausea and pain. She stated, "I should feel better after I am able to eat more." RN aware. Pt will benefit from skilled PT services to increase functional I and mobility for safe discharge.     Follow Up Recommendations Home health PT;Supervision - Intermittent    Equipment Recommendations  Other (comment) (Pt stated she has the recommended FWW.)    Recommendations for Other Services       Precautions / Restrictions Precautions Precautions: Fall Restrictions Weight Bearing Restrictions: Yes RLE Weight Bearing: Weight bearing as tolerated      Mobility  Bed Mobility               General bed mobility comments: Pt up in recliner, NT.  Transfers Overall transfer level: Needs assistance Equipment used: Rolling walker (2 wheeled) Transfers: Sit to/from [facility] Sit to Stand: Min guard Stand pivot transfers: Min guard       General transfer comment: VC for hand placement  Ambulation/Gait Ambulation/Gait assistance: Min guard Ambulation Distance (Feet): 8 Feet Assistive device: Rolling walker (2 wheeled) Gait Pattern/deviations: Step-to pattern;Antalgic;Narrow base of support     General Gait Details: Due to pt's nausea and decreased activity tolerance during this session, further ambulation was not attempted. Will reattempt next session.  Stairs             Wheelchair Mobility    Modified Rankin (Stroke Patients Only)       Balance                                             Pertinent Vitals/Pain Pain Assessment: 0-10 Pain Score: 4  Pain Location: R knee Pain Descriptors / Indicators: Aching Pain Intervention(s): Limited activity within patient's tolerance;Monitored during session    Home Living Family/patient expects to be discharged to:: Private residence Living Arrangements: Spouse/significant other Available Help at Discharge: Family Type of Home: House Home Access: Stairs to enter Entrance Stairs-Rails: Right Entrance Stairs-Number of Steps: 2 Home Layout: One level;Laundry or work area in [place]: Environmental consultant - 2 wheels;Cane - single point      Prior Function Level of Independence: Independent with assistive device(s)         Comments: Ambulated with SPC     Hand Dominance        Extremity/Trunk Assessment   Upper Extremity Assessment: Overall WFL for tasks assessed           Lower Extremity Assessment: Overall WFL for tasks assessed;RLE deficits/detail RLE Deficits / Details: R LE strength grossly 4-/5 due to pain       Communication   Communication: No difficulties  Cognition Arousal/Alertness: Awake/alert Behavior During Therapy: WFL for tasks assessed/performed Overall Cognitive Status: Within Functional Limits for tasks assessed  General Comments      Exercises Total Joint Exercises Goniometric ROM: R knee AAROM 8 to 71 degrees Other Exercises Other Exercises: B LE seated/ long sitting therex: ankle pumps, QS, GS, LAQ and SLR x 10 each. VC for technique and deep breathing to facilitate relaxation. Therapeutic rest breaks for energy conservation. Increased time required to complete due to pain and nausea.      Assessment/Plan    PT Assessment Patient needs continued PT services  PT Diagnosis Difficulty  walking;Generalized weakness   PT Problem List Decreased strength;Decreased range of motion;Decreased activity tolerance;Decreased balance;Pain  PT Treatment Interventions Gait training;Stair training;Therapeutic activities;Therapeutic exercise;Balance training;Patient/family education   PT Goals (Current goals can be found in the Care Plan section) Acute Rehab PT Goals Patient Stated Goal: To do all I can. PT Goal Formulation: With patient Time For Goal Achievement: 02/03/16 Potential to Achieve Goals: Good    Frequency BID   Barriers to discharge Inaccessible home environment stairs    Co-evaluation               End of Session Equipment Utilized During Treatment: Gait belt Activity Tolerance: Patient limited by pain;Other (comment) (limited by nausea) Patient left: in chair;with call bell/phone within reach;with chair alarm set;with SCD's reapplied Nurse Communication: Mobility status         Time: [id number] PT Time Calculation (min) (ACUTE ONLY): 44 min   Charges:   PT Evaluation $PT Eval Moderate Complexity: 1 Procedure PT Treatments $Therapeutic Exercise: 23-37 mins   PT G Codes:        Neoma Laming, PT, DPT  01/20/2016, 11:22 AM [phone]

## 2016-01-20 NOTE — Progress Notes (Signed)
Physical Therapy Treatment Patient Details Name: Laura Mcdaniel MRN: [id number] DOB: 05/18/1946 Today's Date: 01/20/2016    History of Present Illness 70 yo F s/p R TKR on 01/19/16 after unsuccessful conservative management.    PT Comments    Pt with improved nausea during this session but still slightly remains. Occasionally c/o dizziness, BP 161/64. Ambulated 75 ft x2 with FWW and min guard with close chair follow. She demonstrated fair step length and heel/toe pattern with good steadiness. Requires occasional cues for safety. Therapeutic rest breaks for energy conservation.   Follow Up Recommendations  Home health PT;Supervision - Intermittent     Equipment Recommendations  Other (comment)    Recommendations for Other Services       Precautions / Restrictions Precautions Precautions: Fall Restrictions RLE Weight Bearing: Weight bearing as tolerated    Mobility  Bed Mobility Overal bed mobility: Needs Assistance Bed Mobility: Supine to Sit;Sit to Supine     Supine to sit: Min guard Sit to supine: Min guard   General bed mobility comments: uses rail, assistance for R Le  Transfers Overall transfer level: Needs assistance Equipment used: Rolling walker (2 wheeled) Transfers: Sit to/from [facility] Sit to Stand: Min guard Stand pivot transfers: Min guard       General transfer comment: VC for hand placement  Ambulation/Gait Ambulation/Gait assistance: Min guard Ambulation Distance (Feet): 75 Feet Assistive device: Rolling walker (2 wheeled) Gait Pattern/deviations: Decreased stride length;Antalgic     General Gait Details: Pt ambulated 75 ft x2 with FWW and min guard with close chair follow. She has been experiencing some dizziness still and fluctuating BPs, and extensive gait held.   Stairs            Wheelchair Mobility    Modified Rankin (Stroke Patients Only)       Balance Overall balance assessment: Needs  assistance Sitting-balance support: No upper extremity supported Sitting balance-Leahy Scale: Normal Sitting balance - Comments: maintains independently   Standing balance support: No upper extremity supported Standing balance-Leahy Scale: Good Standing balance comment: maintains static balance independently                    Cognition Arousal/Alertness: Awake/alert Behavior During Therapy: WFL for tasks assessed/performed Overall Cognitive Status: Within Functional Limits for tasks assessed                      Exercises Other Exercises Other Exercises: R LE seated therex: LAQ, ankle pumps; supine heelsides x10 each with cues for technique.     General Comments        Pertinent Vitals/Pain Pain Assessment: 0-10 Pain Score: 4  Pain Location: R knee Pain Descriptors / Indicators: Aching Pain Intervention(s): Limited activity within patient's tolerance;Monitored during session;Premedicated before session    Home Living                      Prior Function            PT Goals (current goals can now be found in the care plan section) Acute Rehab PT Goals PT Goal Formulation: With patient Time For Goal Achievement: 02/03/16 Potential to Achieve Goals: Good Progress towards PT goals: Progressing toward goals    Frequency  BID    PT Plan Current plan remains appropriate    Co-evaluation             End of Session Equipment Utilized During Treatment: Gait belt Activity Tolerance: Patient  limited by fatigue Patient left: in bed;with call bell/phone within reach;with bed alarm set;with SCD's reapplied     Time: 1530-1600 PT Time Calculation (min) (ACUTE ONLY): 30 min  Charges:  $Gait Training: 8-22 mins $Therapeutic Exercise: 8-22 mins                    G Codes:      Neoma Laming, PT, DPT  01/20/2016, 5:12 PM [phone]

## 2016-01-20 NOTE — Progress Notes (Signed)
Night time FBS 186

## 2016-01-20 NOTE — NC FL2 (Signed)
[place] LEVEL OF CARE SCREENING TOOL     IDENTIFICATION  Patient Name: Laura Mcdaniel Birthdate: 1946-11-26 Sex: female Admission Date (Current Location): 01/19/2016  [place] and [place] Number:  Engineering geologist and Address:  [facility], [address] [place], [place] [address]      Provider Number: [id number]  Attending Physician Name and Address:  Dereck Leep, MD  Relative Name and Phone Number:       Current Level of Care: Hospital Recommended Level of Care: [place] Prior Approval Number:    Date Approved/Denied:   PASRR Number:  ([id number])  Discharge Plan: SNF    Current Diagnoses: Patient Active Problem List   Diagnosis Date Noted  . S/P total knee arthroplasty 01/19/2016   Arthritis 09/11/2015  Type 2 diabetes mellitus (CMS-HCC) 09/11/2015  Chronic fatigue 10/29/2014  Chest pain, unspecified 10/16/2014  Cardiac syncope 09/20/2014  Bilateral carotid artery stenosis 09/20/2014  Abnormal finding on echocardiogram 09/20/2014  Pernicious anemia   Primary osteoarthritis of right knee   Type 2 diabetes mellitus without complication (CMS-HCC)   Mixed hyperlipidemia   Essential hypertension, benign   Hiatal hernia   Peripheral neuropathy (CMS-HCC)   Carpal tunnel syndrome   Macular degeneration   B12 deficiency   Mitral valve regurgitation   Status post total knee replacement, left      Orientation RESPIRATION BLADDER Height & Weight     Self, Time, Situation, Place  Normal Continent Weight: 173 lb (78.472 kg) Height:  5' 3.5" (161.3 cm)  BEHAVIORAL SYMPTOMS/MOOD NEUROLOGICAL BOWEL NUTRITION STATUS   (none )  (none ) Continent Diet (Regular Diet )  AMBULATORY STATUS COMMUNICATION OF NEEDS Skin   Extensive Assist Verbally Surgical wounds (Incision: Right Leg)                       Personal Care Assistance Level of Assistance  Bathing, Feeding, Dressing Bathing  Assistance: Limited assistance Feeding assistance: Independent Dressing Assistance: Limited assistance     Functional Limitations Info  Sight, Hearing, Speech Sight Info: Adequate Hearing Info: Adequate Speech Info: Adequate    SPECIAL CARE FACTORS FREQUENCY  PT (By licensed PT), OT (By licensed OT)     PT Frequency:  (5) OT Frequency:  (5)            Contractures      Additional Factors Info  Code Status, Insulin Sliding Scale Code Status Info:  (Full Code. )     Insulin Sliding Scale Info:  (NovoLog Insulin Injections 3 times daily. )       Current Medications (01/20/2016):  This is the current hospital active medication list Current Facility-Administered Medications  Medication Dose Route Frequency Provider Last Rate Last Dose  . 0.9 %  sodium chloride infusion   Intravenous Continuous Dereck Leep, MD 100 mL/hr at 01/20/16 0436    . acetaminophen (OFIRMEV) IV 1,000 mg  1,000 mg Intravenous 4 times per day Dereck Leep, MD   1,000 mg at 01/20/16 0522  . acetaminophen (TYLENOL) tablet 650 mg  650 mg Oral Q6H PRN Dereck Leep, MD   650 mg at 01/20/16 1015   Or  . acetaminophen (TYLENOL) suppository 650 mg  650 mg Rectal Q6H PRN Dereck Leep, MD      . alum & mag hydroxide-simeth (MAALOX/MYLANTA) 200-200-20 MG/5ML suspension 30 mL  30 mL Oral Q4H PRN Dereck Leep, MD      .  beta carotene w/minerals (OCUVITE) tablet 1 tablet  1 tablet Oral BID Dereck Leep, MD   1 tablet at 01/19/16 2153  . bisacodyl (DULCOLAX) suppository 10 mg  10 mg Rectal Daily PRN Dereck Leep, MD      . calcium-vitamin D (OSCAL WITH D) 500-200 MG-UNIT per tablet 1 tablet  1 tablet Oral Daily Dereck Leep, MD   1 tablet at 01/20/16 [phone]  . ceFAZolin (ANCEF) IVPB 2 g/50 mL premix  2 g Intravenous Q6H Dereck Leep, MD   2 g at 01/20/16 0550  . diphenhydrAMINE (BENADRYL) 12.5 MG/5ML elixir 12.5-25 mg  12.5-25 mg Oral Q4H PRN Dereck Leep, MD      . enoxaparin (LOVENOX) injection 30  mg  30 mg Subcutaneous Q12H Dereck Leep, MD   30 mg at 01/20/16 0839  . ferrous sulfate tablet 325 mg  325 mg Oral BID WC Dereck Leep, MD   325 mg at 01/20/16 0836  . gabapentin (NEURONTIN) capsule 300 mg  300 mg Oral BID Dereck Leep, MD   300 mg at 01/20/16 0834  . glimepiride (AMARYL) tablet 1 mg  1 mg Oral Q lunch Dereck Leep, MD      . hydrochlorothiazide (HYDRODIURIL) tablet 25 mg  25 mg Oral BH-q7a Dereck Leep, MD   25 mg at 01/20/16 1015  . insulin aspart (novoLOG) injection 0-15 Units  0-15 Units Subcutaneous TID WC Dereck Leep, MD   3 Units at 01/20/16 [phone]  . lisinopril (PRINIVIL,ZESTRIL) tablet 10 mg  10 mg Oral BH-q7a Dereck Leep, MD   10 mg at 01/20/16 1015  . magnesium hydroxide (MILK OF MAGNESIA) suspension 30 mL  30 mL Oral Daily PRN Dereck Leep, MD      . menthol-cetylpyridinium (CEPACOL) lozenge 3 mg  1 lozenge Oral PRN Dereck Leep, MD       Or  . phenol (CHLORASEPTIC) mouth spray 1 spray  1 spray Mouth/Throat PRN Dereck Leep, MD      . metFORMIN (GLUCOPHAGE) tablet 1,000 mg  1,000 mg Oral QPM Dereck Leep, MD   1,000 mg at 01/19/16 1844  . metFORMIN (GLUCOPHAGE-XR) 24 hr tablet 1,000 mg  1,000 mg Oral Q breakfast Dereck Leep, MD   1,000 mg at 01/20/16 0834  . metoCLOPramide (REGLAN) tablet 10 mg  10 mg Oral TID AC & HS Dereck Leep, MD   10 mg at 01/20/16 0835  . morphine 2 MG/ML injection 2 mg  2 mg Intravenous Q2H PRN Dereck Leep, MD   2 mg at 01/19/16 1736  . multivitamin with minerals tablet 1 tablet  1 tablet Oral Once Dereck Leep, MD   1 tablet at 01/19/16 1800  . ondansetron (ZOFRAN) tablet 4 mg  4 mg Oral Q6H PRN Dereck Leep, MD   4 mg at 01/20/16 [id number]   Or  . ondansetron (ZOFRAN) injection 4 mg  4 mg Intravenous Q6H PRN Dereck Leep, MD      . oxyCODONE (Oxy IR/ROXICODONE) immediate release tablet 5-10 mg  5-10 mg Oral Q4H PRN Dereck Leep, MD   10 mg at 01/20/16 0437  . pantoprazole (PROTONIX) EC tablet 40 mg  40 mg  Oral BID Dereck Leep, MD   40 mg at 01/20/16 0836  . senna-docusate (Senokot-S) tablet 1 tablet  1 tablet Oral BID Dereck Leep, MD   1 tablet at 01/20/16 [id number]  .  sodium phosphate (FLEET) 7-19 GM/118ML enema 1 enema  1 enema Rectal Once PRN Dereck Leep, MD      . traMADol Veatrice Bourbon) tablet 50-100 mg  50-100 mg Oral Q4H PRN Dereck Leep, MD      . vitamin C (ASCORBIC ACID) tablet 500 mg  500 mg Oral Daily Dereck Leep, MD   500 mg at 01/20/16 0835  . vitamin E capsule 400 Units  400 Units Oral Daily Dereck Leep, MD   400 Units at 01/20/16 [id number]     Discharge Medications: Please see discharge summary for a list of discharge medications.  Relevant Imaging Results:  Relevant Lab Results:   Additional Information  (SSN: [id number])  Loralyn Freshwater, LCSW

## 2016-01-20 NOTE — Progress Notes (Signed)
Took over care at 1000 assessment unremarkable and unchanged from shift assessment. Pt tolerating pain well with tylenol nausea has decreased.

## 2016-01-20 NOTE — Evaluation (Signed)
Occupational Therapy Evaluation Patient Details Name: Laura Mcdaniel MRN: [id number] DOB: 06/24/1946 Today's Date: 01/20/2016    History of Present Illness This patient is a 69 year old female who came to [facility] for a R TKR. She had her left knee replaced several years ago.    Clinical Impression   This patient is a 69 year old female who came to [facility] for a R total knee replacement.  Patient lives in a one story home with 2 steps to enter.  She had been independent with ADL and functional mobility. She now shows deficits with pain, mobility, and activities of daily living and would benefit from Occupational Therapy from ADL/functional mobility training.      Follow Up Recommendations       Equipment Recommendations       Recommendations for Other Services       Precautions / Restrictions Precautions Precautions: Fall Restrictions Weight Bearing Restrictions: Yes RLE Weight Bearing: Weight bearing as tolerated      Mobility Bed Mobility                Transfers          Balance                                            ADL                                         General ADL Comments: Had been independent and driving.  Today practiced techniques for lower body dressing using hip kit as she cannot yet reach her feet. Patient practiced techniques for Donned/doffed socks and pants to knees (drain still in place).  Needed minimal assist with set up and verbal cues for technique and safety.       Vision     Perception     Praxis      Pertinent Vitals/Pain Pain Assessment: 0-10 Pain Score: 4  Pain Location: R knee Pain Descriptors / Indicators: Aching Pain Intervention(s): Limited activity within patient's tolerance;Monitored during session     Hand Dominance     Extremity/Trunk Assessment Upper Extremity Assessment Upper Extremity Assessment: Overall WFL for tasks assessed   Lower  Extremity Assessment Lower Extremity Assessment: Defer to PT evaluation RLE Deficits / Details: R LE strength grossly 4-/5 due to pain       Communication Communication Communication: No difficulties   Cognition Arousal/Alertness: Awake/alert Behavior During Therapy: WFL for tasks assessed/performed Overall Cognitive Status: Within Functional Limits for tasks assessed                     General Comments       Exercises   Shoulder Instructions      Home Living Family/patient expects to be discharged to:: Private residence Living Arrangements: Spouse/significant other Available Help at Discharge: Family Type of Home: House Home Access: Stairs to enter Entrance Stairs-Number of Steps: 2 Entrance Stairs-Rails: Right Home Layout: One level;Laundry or work area in basement         Bathroom Toilet: Handicapped height     Home Equipment: Walker - 2 wheels;Cane - single point          Prior Functioning/Environment Level of Independence: Independent with assistive device(s)          Comments: Ambulated with SPC    OT Diagnosis: Acute pain   OT Problem List: Decreased range of motion;Decreased activity tolerance;Impaired balance (sitting and/or standing);Decreased knowledge of use of DME or AE;Pain   OT Treatment/Interventions: Self-care/ADL training    OT Goals(Current goals can be found in the care plan section) Acute Rehab OT Goals Patient Stated Goal: To go home OT Goal Formulation: With patient Time For Goal Achievement: 02/03/16 Potential to Achieve Goals: Good  OT Frequency: Min 1X/week   Barriers to D/C:            Co-evaluation              End of Session Equipment Utilized During Treatment:  (Hip kit)  Activity Tolerance:   Patient left: in chair;with chair alarm set;with call bell/phone within reach   Time: 0943-1009 OT Time Calculation (min): 26 min Charges:  OT General Charges $OT Visit: 1 Procedure OT Evaluation $OT Eval  Low Complexity: 1 Procedure OT Treatments $Self Care/Home Management : 8-22 mins G-Codes:    Huff, John M John M Huff, MS/OTR/L  01/20/2016, 11:31 AM    

## 2016-01-20 NOTE — Progress Notes (Signed)
   Subjective: 1 Day Post-Op Procedure(s) (LRB): COMPUTER ASSISTED TOTAL KNEE ARTHROPLASTY (Right) Patient reports pain as 4 on 0-10 scale.   Patient is well, and has had no acute complaints or problems We will start therapy today.  Plan is to go Home after hospital stay. no nausea and no vomiting Patient denies any chest pains or shortness of breath. Objective: Vital signs in last 24 hours: Temp:  [97.1 F (36.2 C)-98.8 F (37.1 C)] 98.8 F (37.1 C) (02/21 0401) Pulse Rate:  [72-89] 89 (02/21 0401) Resp:  [12-19] 18 (02/21 0401) BP: (129-166)/(53-77) 140/69 mmHg (02/21 0401) SpO2:  [98 %-100 %] 98 % (02/21 0401) Weight:  [78.472 kg (173 lb)] 78.472 kg (173 lb) (02/20 0937) Pt has original dressing in place and wound at this time can not be evaluated Heels are non tender and elevated off the bed using rolled towels Intake/Output from previous day: 02/20 0701 - 02/21 0700 In: 2500 [I.V.:2500] Out: 3310 [Urine:3050; Drains:210; Blood:50] Intake/Output this shift:     Recent Labs  01/20/16 0450  HGB 9.7*    Recent Labs  01/20/16 0450  WBC 10.2  RBC 3.59*  HCT 28.9*  PLT 315    Recent Labs  01/20/16 0450  NA 131*  K 3.6  CL 96*  CO2 26  BUN 10  CREATININE 0.52  GLUCOSE 196*  CALCIUM 8.6*   No results for input(s): LABPT, INR in the last 72 hours.  EXAM General - Patient is Alert, Appropriate and Oriented Extremity - Neurologically intact Neurovascular intact Sensation intact distally Intact pulses distally Dorsiflexion/Plantar flexion intact Dressing - dressing C/D/I Motor Function - intact, moving foot and toes well on exam.    Past Medical History  Diagnosis Date  . Difficult intubation   . Diabetes mellitus without complication (HCC)     Assessment/Plan: 1 Day Post-Op Procedure(s) (LRB): COMPUTER ASSISTED TOTAL KNEE ARTHROPLASTY (Right) Active Problems:   S/P total knee arthroplasty  Estimated body mass index is 30.16 kg/(m^2) as  calculated from the following:   Height as of this encounter: 5' 3.5" (1.613 m).   Weight as of this encounter: 78.472 kg (173 lb). Advance diet Up with therapy D/C IV fluids Plan for discharge tomorrow Discharge home with home health  Labs: reviewed DVT Prophylaxis - Lovenox, Foot Pumps and TED hose Weight-Bearing as tolerated to right leg D/C O2 and Pulse OX and try on Room Air Begin working on a bowel movement Labs in am   [employer]. [place] 01/20/2016, 7:01 AM

## 2016-01-20 NOTE — Discharge Instructions (Signed)

## 2016-01-20 NOTE — Anesthesia Postprocedure Evaluation (Signed)
Anesthesia Post Note  Patient: Laura Mcdaniel  Procedure(s) Performed: Procedure(s) (LRB): COMPUTER ASSISTED TOTAL KNEE ARTHROPLASTY (Right)  Patient location during evaluation: Nursing Unit Anesthesia Type: Spinal Level of consciousness: awake and alert and oriented Pain management: satisfactory to patient Vital Signs Assessment: post-procedure vital signs reviewed and stable Respiratory status: respiratory function stable Cardiovascular status: stable Anesthetic complications: no    Last Vitals:  Filed Vitals:   01/19/16 2240 01/20/16 0401  BP: 155/70 140/69  Pulse: 85 89  Temp: 36.7 C 37.1 C  Resp: 18 18    Last Pain:  Filed Vitals:   01/20/16 0436  PainSc: 5                  Lamere Lightner, Rene Kocher

## 2016-01-21 LAB — CBC
HEMATOCRIT: 28.9 % — AB (ref 35.0–47.0)
HEMOGLOBIN: 9.7 g/dL — AB (ref 12.0–16.0)
MCH: 26.1 pg (ref 26.0–34.0)
MCHC: 33.4 g/dL (ref 32.0–36.0)
MCV: 78.2 fL — ABNORMAL LOW (ref 80.0–100.0)
Platelets: 355 10*3/uL (ref 150–440)
RBC: 3.7 MIL/uL — AB (ref 3.80–5.20)
RDW: 14.7 % — ABNORMAL HIGH (ref 11.5–14.5)
WBC: 10.7 10*3/uL (ref 3.6–11.0)

## 2016-01-21 LAB — GLUCOSE, CAPILLARY
GLUCOSE-CAPILLARY: 186 mg/dL — AB (ref 65–99)
Glucose-Capillary: 211 mg/dL — ABNORMAL HIGH (ref 65–99)

## 2016-01-21 NOTE — Progress Notes (Signed)
Pt discharged home with home health orders. Pt refused afternoon meds states she will take her own at home.

## 2016-01-21 NOTE — Plan of Care (Signed)
Problem: Pain Management: Goal: Pain level will decrease with appropriate interventions Outcome: Completed/Met Date Met:  01/21/16 Minimal pain. Pt using Tylenol for pain control with good results.  Problem: Skin Integrity: Goal: Signs of wound healing will improve Outcome: Progressing Dressing remaining dry and intact.  Problem: Fluid Volume: Goal: Ability to maintain a balanced intake and output will improve Outcome: Progressing Drinking po fluids.  Problem: Bowel/Gastric: Goal: Will not experience complications related to bowel motility Outcome: Progressing Able to pass flatus.     

## 2016-01-21 NOTE — Care Management Note (Signed)
Case Management Note  Patient Details  Name: Laura Mcdaniel MRN: [id number] Date of Birth: Feb 26, 1946  Subjective/Objective:    Updated Laura Mcdaniel that her Lovenox co-pay is $85.00. Provided her with a $25.00 coupon.                 Action/Plan:   Expected Discharge Date:                  Expected Discharge Plan:     In-House Referral:     Discharge planning Services     Post Acute Care Choice:    Choice offered to:     DME Arranged:    DME Agency:     HH Arranged:    [place] Agency:     Status of Service:     Medicare Important Message Given:  Yes Date Medicare IM Given:    Medicare IM give by:    Date Additional Medicare IM Given:    Additional Medicare Important Message give by:     If discussed at [place] of Stay Meetings, dates discussed:    Additional Comments:  Akiel Fennell A, RN 01/21/2016, 9:06 AM

## 2016-01-21 NOTE — Care Management Important Message (Signed)
Important Message  Patient Details  Name: Laura Mcdaniel MRN: [id number] Date of Birth: 14-Nov-1946   Medicare Important Message Given:  Yes    Emmalyn Hinson A, RN 01/21/2016, 8:29 AM

## 2016-01-21 NOTE — Discharge Summary (Signed)
Physician Discharge Summary  Patient ID: Laura Mcdaniel MRN: [id number] DOB/AGE: 03/27/1946 70 y.o.  Admit date: 01/19/2016 Discharge date: 01/21/2016  Admission Diagnoses:  CHRONIC RIGHT KNEE PAIN   Discharge Diagnoses: Patient Active Problem List   Diagnosis Date Noted  . S/P total knee arthroplasty 01/19/2016    Past Medical History  Diagnosis Date  . Difficult intubation   . Diabetes mellitus without complication ([place])      Transfusion: Autovac transfusions given the first 6 hours postoperatively   Consultants (if any):   case management for home health assistance  Discharged Condition: Improved  Hospital Course: Laura Mcdaniel is an 70 y.o. female who was admitted 01/19/2016 with a diagnosis of degenerative arthrosis right knee and went to the operating room on 01/19/2016 and underwent the above named procedures.    Surgeries:Procedure(s): COMPUTER ASSISTED TOTAL KNEE ARTHROPLASTY on 01/19/2016  PRE-OPERATIVE DIAGNOSIS: Degenerative arthrosis of the right knee, primary  POST-OPERATIVE DIAGNOSIS: Same  PROCEDURE: Right total knee arthroplasty using computer-assisted navigation  SURGEON: Marciano Sequin. M.D.  ASSISTANT: Vance Peper, PA (present and scrubbed throughout the case, critical for assistance with exposure, retraction, instrumentation, and closure)  ANESTHESIA: spinal  ESTIMATED BLOOD LOSS: 50 mL  FLUIDS REPLACED: 2000 mL of crystalloid  TOURNIQUET TIME: 102 minutes  DRAINS: 2 medium drains to a reinfusion system  SOFT TISSUE RELEASES: Anterior cruciate ligament, posterior cruciate ligament, deep medial collateral ligament, patellofemoral ligament , and posterolateral corner  IMPLANTS UTILIZED: DePuy PFC Sigma size 3 posterior stabilized femoral component (cemented), size 3 MBT tibial component (cemented), 35 mm 3 peg oval dome patella (cemented), and a 10 mm stabilized rotating platform polyethylene insert.  INDICATIONS FOR SURGERY: Laura Mcdaniel is a 70 y.o. year old female with a long history of progressive knee pain. X-rays demonstrated severe degenerative changes in tricompartmental fashion. The patient had not seen any significant improvement despite conservative nonsurgical intervention. After discussion of the risks and benefits of surgical intervention, the patient expressed understanding of the risks benefits and agree with plans for total knee arthroplasty.   The risks, benefits, and alternatives were discussed at length including but not limited to the risks of infection, bleeding, nerve injury, stiffness, blood clots, the need for revision surgery, cardiopulmonary complications, among others, and they were willing to proceed. Patient tolerated the surgery well. No complications .Patient was taken to PACU where she was stabilized and then transferred to the orthopedic floor.  Patient started on Lovenox 30 q 12 hrs. Foot pumps applied bilaterally at 80 mm hg. Heels elevated off bed with rolled towels. No evidence of DVT. Calves non tender. Negative [place]. Physical therapy started on day #1 for gait training and transfer with OT starting on  day #1 for ADL and assisted devices. Patient has done well with therapy. Ambulated greater than 200 feet upon being discharged. Was able to go up and down 4 steps independently and safely.  Patient's IV and Foley were discontinued on day #1 with the Hemovac being discharged on day #2 along with the dressing change.     She was given perioperative antibiotics:  Anti-infectives    Start     Dose/Rate Route Frequency Ordered Stop   01/19/16 1800  ceFAZolin (ANCEF) IVPB 2 g/50 mL premix     2 g 100 mL/hr over 30 Minutes Intravenous Every 6 hours 01/19/16 1707 01/20/16 1252   01/19/16 0759  clindamycin (CLEOCIN) 900 MG/50ML IVPB    Comments:  Ronnell Freshwater: cabinet override  01/19/16 0759 01/19/16 1225   01/19/16 0200  clindamycin (CLEOCIN) IVPB 900 mg  Status:  Discontinued     900  mg 100 mL/hr over 30 Minutes Intravenous  Once 01/19/16 0152 01/19/16 0931    .  She was fitted with AV 1 compression foot pump devices bilaterally, instructed on heel pumps, early ambulation, and fitted with TED stockings bilaterally for DVT prophylaxis.  She benefited maximally from the hospital stay and there were no complications.    Recent vital signs:  Filed Vitals:   01/20/16 1921 01/21/16 0346  BP: 169/67 154/71  Pulse: 87 97  Temp: 99 F (37.2 C) 98.6 F (37 C)  Resp: 20 18    Recent laboratory studies:  Lab Results  Component Value Date   HGB 9.7* 01/21/2016   HGB 9.7* 01/20/2016   HGB 11.1* 01/08/2016   Lab Results  Component Value Date   WBC 10.7 01/21/2016   PLT 355 01/21/2016   Lab Results  Component Value Date   INR 0.99 01/08/2016   Lab Results  Component Value Date   NA 131* 01/20/2016   K 3.6 01/20/2016   CL 96* 01/20/2016   CO2 26 01/20/2016   BUN 10 01/20/2016   CREATININE 0.52 01/20/2016   GLUCOSE 196* 01/20/2016    Discharge Medications:     Medication List    STOP taking these medications        aspirin 81 MG tablet     naproxen sodium 220 MG tablet  Commonly known as:  ANAPROX      TAKE these medications        atorvastatin 40 MG tablet  Commonly known as:  LIPITOR  Take 40 mg by mouth daily at 6 PM.     BIOTIN MAXIMUM STRENGTH 10 MG Tabs  Generic drug:  Biotin  Take 10 mg by mouth once.     calcium-vitamin D 250-125 MG-UNIT tablet  Commonly known as:  OSCAL WITH D  Take 1 tablet by mouth daily. Calcium 600mg  and Vit D 800 IU twice daily     CENTRUM SILVER ADULT 50+ PO  Take 1 tablet by mouth once.     EYE VITAMINS & MINERALS PO  Take 1 tablet by mouth 2 (two) times daily.     enoxaparin 40 MG/0.4ML injection  Commonly known as:  LOVENOX  Inject 0.4 mLs (40 mg total) into the skin daily.     EYLEA IO  Inject into the eye. Per ophthamology Office     gabapentin 300 MG capsule  Commonly known as:  NEURONTIN   Take 300 mg by mouth 2 (two) times daily.     glimepiride 1 MG tablet  Commonly known as:  AMARYL  Take 1 mg by mouth daily with lunch.     hydrochlorothiazide 25 MG tablet  Commonly known as:  HYDRODIURIL  Take 25 mg by mouth every morning.     lisinopril 10 MG tablet  Commonly known as:  PRINIVIL,ZESTRIL  Take 10 mg by mouth every morning.     metFORMIN 1000 MG (MOD) 24 hr tablet  Commonly known as:  GLUMETZA  Take 1,000 mg by mouth daily with breakfast.     metFORMIN 1000 MG tablet  Commonly known as:  GLUCOPHAGE  Take 1,000 mg by mouth every evening. Take 1500mg  in evening     omeprazole 20 MG capsule  Commonly known as:  PRILOSEC  Take 20 mg by mouth 2 (two) times daily before a meal. as  needed     oxyCODONE 5 MG immediate release tablet  Commonly known as:  Oxy IR/ROXICODONE  Take 1-2 tablets (5-10 mg total) by mouth every 4 (four) hours as needed for severe pain or breakthrough pain.     traMADol 50 MG tablet  Commonly known as:  ULTRAM  Take 1-2 tablets (50-100 mg total) by mouth every 4 (four) hours as needed for moderate pain.     vitamin C 500 MG tablet  Commonly known as:  ASCORBIC ACID  Take 500 mg by mouth daily.     vitamin E 400 UNIT capsule  Take 400 Units by mouth daily.        Diagnostic Studies: Dg Knee Right Port  01/19/2016  CLINICAL DATA:  Immediate postop (postop day 1) right total knee arthroplasty for osteoarthritis. EXAM: PORTABLE RIGHT KNEE - 1-2 VIEW COMPARISON:  Right knee MRI 05/23/2006. FINDINGS: Right total knee arthroplasty with anatomic alignment. No acute complicating features. Surgical drains in place. IMPRESSION: Anatomic alignment post right total knee arthroplasty without acute complicating features. Electronically Signed   By: Evangeline Dakin M.D.   On: 01/19/2016 16:16    Disposition:       Discharge Instructions    Diet - low sodium heart healthy    Complete by:  As directed      Diet - low sodium heart healthy     Complete by:  As directed      Increase activity slowly    Complete by:  As directed      Increase activity slowly    Complete by:  As directed            Follow-up Information    Follow up with [facility] PA On 02/03/2016.   Specialty:  Physician Assistant   Why:  at 9:45am   Contact information:   [address] [facility] [place] [address] [phone]       Follow up with Dereck Leep, MD On 03/02/2016.   Specialty:  Orthopedic Surgery   Why:  at 9:45am   Contact information:   [place] [address] [phone]        Signed: Watt Climes. 01/21/2016, 7:11 AM

## 2016-01-21 NOTE — Progress Notes (Signed)
Physical Therapy Treatment Patient Details Name: Laura Mcdaniel MRN: [id number] DOB: 05-01-1946 Today's Date: 01/21/2016    History of Present Illness 70 yo F s/p R TKR on 01/19/16 after unsuccessful conservative management.    PT Comments    Pt demonstrated significant progress towards functional goals today. She was able to ambulate with FWW and supervision ~250 ft with good speed and steadiness. Transfers and bed mobility are performed with mod I with FWW. AAROM R knee improved to 5 - 81 degrees. She navigated 4 steps with L rail (ascending) with supervision and min cues for sequence. Pt has no c/o dizziness or nausea during session.   Follow Up Recommendations  Home health PT;Supervision - Intermittent     Equipment Recommendations       Recommendations for Other Services       Precautions / Restrictions Precautions Precautions: Fall Restrictions RLE Weight Bearing: Weight bearing as tolerated    Mobility  Bed Mobility Overal bed mobility: Modified Independent Bed Mobility: Supine to Sit;Sit to Supine     Supine to sit: Modified independent (Device/Increase time) Sit to supine: Modified independent (Device/Increase time)   General bed mobility comments: uses rail  Transfers Overall transfer level: Modified independent Equipment used: Rolling walker (2 wheeled) Transfers: Sit to/from [facility] Sit to Stand: Modified independent (Device/Increase time) Stand pivot transfers: Modified independent (Device/Increase time)       General transfer comment: demonstrated good safety and steadiness  Ambulation/Gait Ambulation/Gait assistance: Supervision Ambulation Distance (Feet): 250 Feet Assistive device: Rolling walker (2 wheeled) Gait Pattern/deviations: Decreased step length - left     General Gait Details: Shows good steadiness, no LOB, good speed.   Stairs Stairs: Yes Stairs assistance: Supervision Stair Management: One rail  Left Number of Stairs: 4 General stair comments: pt is steady; min VC for sequence  Wheelchair Mobility    Modified Rankin (Stroke Patients Only)       Balance Overall balance assessment: Modified Independent Sitting-balance support: Feet unsupported;No upper extremity supported Sitting balance-Leahy Scale: Normal Sitting balance - Comments: maintains independently   Standing balance support: No upper extremity supported Standing balance-Leahy Scale: Good Standing balance comment: pt is steady                    Cognition Arousal/Alertness: Awake/alert Behavior During Therapy: WFL for tasks assessed/performed Overall Cognitive Status: Within Functional Limits for tasks assessed                      Exercises Total Joint Exercises Goniometric ROM: AAROM R Knee 5 to 81 degrees Other Exercises Other Exercises: R LE seated therex: ankle pumps, LAQ, heel slides; supine QS x10 each. Cues for technique.    General Comments        Pertinent Vitals/Pain Pain Assessment: 0-10 Pain Score: 3  Pain Location: R knee Pain Descriptors / Indicators: Aching Pain Intervention(s): Monitored during session;Ice applied    Home Living                      Prior Function            PT Goals (current goals can now be found in the care plan section) Acute Rehab PT Goals Patient Stated Goal: To go home PT Goal Formulation: With patient Time For Goal Achievement: 02/03/16 Potential to Achieve Goals: Good Progress towards PT goals: Progressing toward goals    Frequency  BID    PT Plan Current plan remains  appropriate    Co-evaluation             End of Session Equipment Utilized During Treatment: Gait belt Activity Tolerance: Patient tolerated treatment well Patient left: in bed;with call bell/phone within reach;with bed alarm set;with family/visitor present;with SCD's reapplied     Time: [id number] PT Time Calculation (min) (ACUTE ONLY): 27  min  Charges:  $Gait Training: 8-22 mins $Therapeutic Exercise: 8-22 mins                    G Codes:      Neoma Laming, PT, DPT  01/21/2016, 9:45 AM [phone]

## 2016-01-21 NOTE — Progress Notes (Signed)
   Subjective: 2 Days Post-Op Procedure(s) (LRB): COMPUTER ASSISTED TOTAL KNEE ARTHROPLASTY (Right) Patient reports pain as 4 on 0-10 scale.   Patient is well, and has had no acute complaints or problems Continue with physical therapy today.  Plan is to go Home after hospital stay. no nausea and no vomiting Patient denies any chest pains or shortness of breath. Objective: Vital signs in last 24 hours: Temp:  [98.2 F (36.8 C)-99 F (37.2 C)] 98.6 F (37 C) (02/22 0346) Pulse Rate:  [78-97] 97 (02/22 0346) Resp:  [16-20] 18 (02/22 0346) BP: (150-169)/(59-71) 154/71 mmHg (02/22 0346) SpO2:  [95 %-100 %] 97 % (02/22 0346) well approximated incision Heels are non tender and elevated off the bed using rolled towels Intake/Output from previous day: 02/21 0701 - 02/22 0700 In: 1925 [P.O.:600; I.V.:1325] Out: 750 [Urine:550; Drains:200] Intake/Output this shift:     Recent Labs  01/20/16 0450 01/21/16 0601  HGB 9.7* 9.7*    Recent Labs  01/20/16 0450 01/21/16 0601  WBC 10.2 10.7  RBC 3.59* 3.70*  HCT 28.9* 28.9*  PLT 315 355    Recent Labs  01/20/16 0450  NA 131*  K 3.6  CL 96*  CO2 26  BUN 10  CREATININE 0.52  GLUCOSE 196*  CALCIUM 8.6*   No results for input(s): LABPT, INR in the last 72 hours.  EXAM General - Patient is Alert, Appropriate and Oriented Extremity - Neurologically intact Neurovascular intact Sensation intact distally Intact pulses distally Dorsiflexion/Plantar flexion intact Dressing - dressing C/D/I Motor Function - intact, moving foot and toes well on exam.    Past Medical History  Diagnosis Date  . Difficult intubation   . Diabetes mellitus without complication ([facility])     Assessment/Plan: 2 Days Post-Op Procedure(s) (LRB): COMPUTER ASSISTED TOTAL KNEE ARTHROPLASTY (Right) Active Problems:   S/P total knee arthroplasty  Estimated body mass index is 30.16 kg/(m^2) as calculated from the following:   Height as of this  encounter: 5' 3.5" (1.613 m).   Weight as of this encounter: 78.472 kg (173 lb). Up with therapy Discharge home with home health after patient completes physical therapy and has a bowel movement  Labs: Were reviewed  DVT Prophylaxis - Lovenox, Foot Pumps and TED hose Weight-Bearing as tolerated to right leg Patient needs to have a bowel movement today. Patient will need to do physical therapy which will consist of finishing her lap around the nurse's desk and steps prior to being discharged. Please change dressing prior to being discharged and give the patient 2 extreme honeycomb dressings to take home. Please copy discharge instructions if the patient prior to discharge  Laura Mcdaniel [place] 01/21/2016, 7:06 AM

## 2016-01-22 DIAGNOSIS — Z471 Aftercare following joint replacement surgery: Secondary | ICD-10-CM | POA: Diagnosis not present

## 2016-01-22 DIAGNOSIS — Z7984 Long term (current) use of oral hypoglycemic drugs: Secondary | ICD-10-CM | POA: Diagnosis not present

## 2016-01-22 DIAGNOSIS — Z96651 Presence of right artificial knee joint: Secondary | ICD-10-CM | POA: Diagnosis not present

## 2016-01-22 DIAGNOSIS — Z79891 Long term (current) use of opiate analgesic: Secondary | ICD-10-CM | POA: Diagnosis not present

## 2016-01-22 DIAGNOSIS — E119 Type 2 diabetes mellitus without complications: Secondary | ICD-10-CM | POA: Diagnosis not present

## 2016-01-22 DIAGNOSIS — Z7901 Long term (current) use of anticoagulants: Secondary | ICD-10-CM | POA: Diagnosis not present

## 2016-01-22 DIAGNOSIS — R030 Elevated blood-pressure reading, without diagnosis of hypertension: Secondary | ICD-10-CM | POA: Diagnosis not present

## 2016-01-26 DIAGNOSIS — E119 Type 2 diabetes mellitus without complications: Secondary | ICD-10-CM | POA: Diagnosis not present

## 2016-01-26 DIAGNOSIS — Z79891 Long term (current) use of opiate analgesic: Secondary | ICD-10-CM | POA: Diagnosis not present

## 2016-01-26 DIAGNOSIS — Z471 Aftercare following joint replacement surgery: Secondary | ICD-10-CM | POA: Diagnosis not present

## 2016-01-26 DIAGNOSIS — Z7901 Long term (current) use of anticoagulants: Secondary | ICD-10-CM | POA: Diagnosis not present

## 2016-01-26 DIAGNOSIS — Z96651 Presence of right artificial knee joint: Secondary | ICD-10-CM | POA: Diagnosis not present

## 2016-01-26 DIAGNOSIS — R030 Elevated blood-pressure reading, without diagnosis of hypertension: Secondary | ICD-10-CM | POA: Diagnosis not present

## 2016-01-26 DIAGNOSIS — Z7984 Long term (current) use of oral hypoglycemic drugs: Secondary | ICD-10-CM | POA: Diagnosis not present

## 2016-02-02 DIAGNOSIS — Z96651 Presence of right artificial knee joint: Secondary | ICD-10-CM | POA: Diagnosis not present

## 2016-02-02 DIAGNOSIS — E119 Type 2 diabetes mellitus without complications: Secondary | ICD-10-CM | POA: Diagnosis not present

## 2016-02-02 DIAGNOSIS — R030 Elevated blood-pressure reading, without diagnosis of hypertension: Secondary | ICD-10-CM | POA: Diagnosis not present

## 2016-02-02 DIAGNOSIS — Z79891 Long term (current) use of opiate analgesic: Secondary | ICD-10-CM | POA: Diagnosis not present

## 2016-02-02 DIAGNOSIS — Z7984 Long term (current) use of oral hypoglycemic drugs: Secondary | ICD-10-CM | POA: Diagnosis not present

## 2016-02-02 DIAGNOSIS — Z7901 Long term (current) use of anticoagulants: Secondary | ICD-10-CM | POA: Diagnosis not present

## 2016-02-02 DIAGNOSIS — Z471 Aftercare following joint replacement surgery: Secondary | ICD-10-CM | POA: Diagnosis not present

## 2016-02-03 DIAGNOSIS — E538 Deficiency of other specified B group vitamins: Secondary | ICD-10-CM | POA: Diagnosis not present

## 2016-02-03 DIAGNOSIS — D510 Vitamin B12 deficiency anemia due to intrinsic factor deficiency: Secondary | ICD-10-CM | POA: Diagnosis not present

## 2016-02-03 DIAGNOSIS — Z96651 Presence of right artificial knee joint: Secondary | ICD-10-CM | POA: Diagnosis not present

## 2016-02-04 DIAGNOSIS — Z96651 Presence of right artificial knee joint: Secondary | ICD-10-CM | POA: Diagnosis not present

## 2016-02-10 DIAGNOSIS — Z96651 Presence of right artificial knee joint: Secondary | ICD-10-CM | POA: Diagnosis not present

## 2016-02-12 DIAGNOSIS — Z96651 Presence of right artificial knee joint: Secondary | ICD-10-CM | POA: Diagnosis not present

## 2016-02-12 DIAGNOSIS — E119 Type 2 diabetes mellitus without complications: Secondary | ICD-10-CM | POA: Diagnosis not present

## 2016-02-17 DIAGNOSIS — Z96651 Presence of right artificial knee joint: Secondary | ICD-10-CM | POA: Diagnosis not present

## 2016-02-19 DIAGNOSIS — Z96651 Presence of right artificial knee joint: Secondary | ICD-10-CM | POA: Diagnosis not present

## 2016-02-25 DIAGNOSIS — Z96651 Presence of right artificial knee joint: Secondary | ICD-10-CM | POA: Diagnosis not present

## 2016-03-02 DIAGNOSIS — Z96651 Presence of right artificial knee joint: Secondary | ICD-10-CM | POA: Diagnosis not present

## 2016-03-09 DIAGNOSIS — I10 Essential (primary) hypertension: Secondary | ICD-10-CM | POA: Diagnosis not present

## 2016-03-09 DIAGNOSIS — D510 Vitamin B12 deficiency anemia due to intrinsic factor deficiency: Secondary | ICD-10-CM | POA: Diagnosis not present

## 2016-03-09 DIAGNOSIS — E119 Type 2 diabetes mellitus without complications: Secondary | ICD-10-CM | POA: Diagnosis not present

## 2016-03-09 DIAGNOSIS — E538 Deficiency of other specified B group vitamins: Secondary | ICD-10-CM | POA: Diagnosis not present

## 2016-03-09 DIAGNOSIS — D509 Iron deficiency anemia, unspecified: Secondary | ICD-10-CM | POA: Diagnosis not present

## 2016-03-11 DIAGNOSIS — Z471 Aftercare following joint replacement surgery: Secondary | ICD-10-CM | POA: Diagnosis not present

## 2016-03-17 DIAGNOSIS — R42 Dizziness and giddiness: Secondary | ICD-10-CM | POA: Diagnosis not present

## 2016-03-17 DIAGNOSIS — R000 Tachycardia, unspecified: Secondary | ICD-10-CM | POA: Diagnosis not present

## 2016-03-17 DIAGNOSIS — I10 Essential (primary) hypertension: Secondary | ICD-10-CM | POA: Diagnosis not present

## 2016-03-17 DIAGNOSIS — R55 Syncope and collapse: Secondary | ICD-10-CM | POA: Diagnosis not present

## 2016-03-17 DIAGNOSIS — D500 Iron deficiency anemia secondary to blood loss (chronic): Secondary | ICD-10-CM | POA: Diagnosis not present

## 2016-03-17 DIAGNOSIS — I6523 Occlusion and stenosis of bilateral carotid arteries: Secondary | ICD-10-CM | POA: Diagnosis not present

## 2016-03-17 DIAGNOSIS — E782 Mixed hyperlipidemia: Secondary | ICD-10-CM | POA: Diagnosis not present

## 2016-03-17 DIAGNOSIS — E119 Type 2 diabetes mellitus without complications: Secondary | ICD-10-CM | POA: Diagnosis not present

## 2016-03-24 DIAGNOSIS — R000 Tachycardia, unspecified: Secondary | ICD-10-CM | POA: Diagnosis not present

## 2016-04-09 DIAGNOSIS — D510 Vitamin B12 deficiency anemia due to intrinsic factor deficiency: Secondary | ICD-10-CM | POA: Diagnosis not present

## 2016-04-09 DIAGNOSIS — E538 Deficiency of other specified B group vitamins: Secondary | ICD-10-CM | POA: Diagnosis not present

## 2016-05-04 DIAGNOSIS — E113393 Type 2 diabetes mellitus with moderate nonproliferative diabetic retinopathy without macular edema, bilateral: Secondary | ICD-10-CM | POA: Diagnosis not present

## 2016-05-11 DIAGNOSIS — D510 Vitamin B12 deficiency anemia due to intrinsic factor deficiency: Secondary | ICD-10-CM | POA: Diagnosis not present

## 2016-05-11 DIAGNOSIS — E538 Deficiency of other specified B group vitamins: Secondary | ICD-10-CM | POA: Diagnosis not present

## 2016-05-19 DIAGNOSIS — H43813 Vitreous degeneration, bilateral: Secondary | ICD-10-CM | POA: Diagnosis not present

## 2016-05-19 DIAGNOSIS — H353121 Nonexudative age-related macular degeneration, left eye, early dry stage: Secondary | ICD-10-CM | POA: Diagnosis not present

## 2016-05-19 DIAGNOSIS — H353211 Exudative age-related macular degeneration, right eye, with active choroidal neovascularization: Secondary | ICD-10-CM | POA: Diagnosis not present

## 2016-06-03 DIAGNOSIS — K1379 Other lesions of oral mucosa: Secondary | ICD-10-CM | POA: Diagnosis not present

## 2016-06-03 DIAGNOSIS — E119 Type 2 diabetes mellitus without complications: Secondary | ICD-10-CM | POA: Diagnosis not present

## 2016-06-04 DIAGNOSIS — H353211 Exudative age-related macular degeneration, right eye, with active choroidal neovascularization: Secondary | ICD-10-CM | POA: Diagnosis not present

## 2016-06-11 DIAGNOSIS — D510 Vitamin B12 deficiency anemia due to intrinsic factor deficiency: Secondary | ICD-10-CM | POA: Diagnosis not present

## 2016-06-11 DIAGNOSIS — E538 Deficiency of other specified B group vitamins: Secondary | ICD-10-CM | POA: Diagnosis not present

## 2016-06-22 DIAGNOSIS — L821 Other seborrheic keratosis: Secondary | ICD-10-CM | POA: Diagnosis not present

## 2016-06-22 DIAGNOSIS — D1800 Hemangioma unspecified site: Secondary | ICD-10-CM | POA: Diagnosis not present

## 2016-06-22 DIAGNOSIS — D229 Melanocytic nevi, unspecified: Secondary | ICD-10-CM | POA: Diagnosis not present

## 2016-06-22 DIAGNOSIS — L820 Inflamed seborrheic keratosis: Secondary | ICD-10-CM | POA: Diagnosis not present

## 2016-06-22 DIAGNOSIS — L814 Other melanin hyperpigmentation: Secondary | ICD-10-CM | POA: Diagnosis not present

## 2016-06-22 DIAGNOSIS — L578 Other skin changes due to chronic exposure to nonionizing radiation: Secondary | ICD-10-CM | POA: Diagnosis not present

## 2016-07-02 DIAGNOSIS — K140 Glossitis: Secondary | ICD-10-CM | POA: Diagnosis not present

## 2016-07-13 DIAGNOSIS — R42 Dizziness and giddiness: Secondary | ICD-10-CM | POA: Diagnosis not present

## 2016-07-13 DIAGNOSIS — R000 Tachycardia, unspecified: Secondary | ICD-10-CM | POA: Diagnosis not present

## 2016-07-13 DIAGNOSIS — E119 Type 2 diabetes mellitus without complications: Secondary | ICD-10-CM | POA: Diagnosis not present

## 2016-07-13 DIAGNOSIS — E538 Deficiency of other specified B group vitamins: Secondary | ICD-10-CM | POA: Diagnosis not present

## 2016-07-13 DIAGNOSIS — I10 Essential (primary) hypertension: Secondary | ICD-10-CM | POA: Diagnosis not present

## 2016-07-13 DIAGNOSIS — E782 Mixed hyperlipidemia: Secondary | ICD-10-CM | POA: Diagnosis not present

## 2016-07-13 DIAGNOSIS — R55 Syncope and collapse: Secondary | ICD-10-CM | POA: Diagnosis not present

## 2016-07-20 DIAGNOSIS — I10 Essential (primary) hypertension: Secondary | ICD-10-CM | POA: Diagnosis not present

## 2016-07-20 DIAGNOSIS — E119 Type 2 diabetes mellitus without complications: Secondary | ICD-10-CM | POA: Diagnosis not present

## 2016-07-20 DIAGNOSIS — Z96651 Presence of right artificial knee joint: Secondary | ICD-10-CM | POA: Diagnosis not present

## 2016-07-20 DIAGNOSIS — E782 Mixed hyperlipidemia: Secondary | ICD-10-CM | POA: Diagnosis not present

## 2016-07-20 DIAGNOSIS — Z9181 History of falling: Secondary | ICD-10-CM | POA: Diagnosis not present

## 2016-07-20 DIAGNOSIS — G63 Polyneuropathy in diseases classified elsewhere: Secondary | ICD-10-CM | POA: Diagnosis not present

## 2016-07-27 DIAGNOSIS — H353121 Nonexudative age-related macular degeneration, left eye, early dry stage: Secondary | ICD-10-CM | POA: Diagnosis not present

## 2016-07-27 DIAGNOSIS — H43813 Vitreous degeneration, bilateral: Secondary | ICD-10-CM | POA: Diagnosis not present

## 2016-07-27 DIAGNOSIS — H353211 Exudative age-related macular degeneration, right eye, with active choroidal neovascularization: Secondary | ICD-10-CM | POA: Diagnosis not present

## 2016-08-10 DIAGNOSIS — E1142 Type 2 diabetes mellitus with diabetic polyneuropathy: Secondary | ICD-10-CM | POA: Diagnosis not present

## 2016-08-10 DIAGNOSIS — L851 Acquired keratosis [keratoderma] palmaris et plantaris: Secondary | ICD-10-CM | POA: Diagnosis not present

## 2016-08-10 DIAGNOSIS — B351 Tinea unguium: Secondary | ICD-10-CM | POA: Diagnosis not present

## 2016-08-13 DIAGNOSIS — D510 Vitamin B12 deficiency anemia due to intrinsic factor deficiency: Secondary | ICD-10-CM | POA: Diagnosis not present

## 2016-08-13 DIAGNOSIS — E538 Deficiency of other specified B group vitamins: Secondary | ICD-10-CM | POA: Diagnosis not present

## 2016-08-25 DIAGNOSIS — Z23 Encounter for immunization: Secondary | ICD-10-CM | POA: Diagnosis not present

## 2016-09-14 DIAGNOSIS — D510 Vitamin B12 deficiency anemia due to intrinsic factor deficiency: Secondary | ICD-10-CM | POA: Diagnosis not present

## 2016-09-21 DIAGNOSIS — H353121 Nonexudative age-related macular degeneration, left eye, early dry stage: Secondary | ICD-10-CM | POA: Diagnosis not present

## 2016-09-21 DIAGNOSIS — H43813 Vitreous degeneration, bilateral: Secondary | ICD-10-CM | POA: Diagnosis not present

## 2016-09-21 DIAGNOSIS — H35373 Puckering of macula, bilateral: Secondary | ICD-10-CM | POA: Diagnosis not present

## 2016-09-21 DIAGNOSIS — H353211 Exudative age-related macular degeneration, right eye, with active choroidal neovascularization: Secondary | ICD-10-CM | POA: Diagnosis not present

## 2016-10-08 ENCOUNTER — Other Ambulatory Visit: Payer: Self-pay | Admitting: Physician Assistant

## 2016-10-08 DIAGNOSIS — Z1231 Encounter for screening mammogram for malignant neoplasm of breast: Secondary | ICD-10-CM

## 2016-10-19 DIAGNOSIS — E538 Deficiency of other specified B group vitamins: Secondary | ICD-10-CM | POA: Diagnosis not present

## 2016-10-19 DIAGNOSIS — D510 Vitamin B12 deficiency anemia due to intrinsic factor deficiency: Secondary | ICD-10-CM | POA: Diagnosis not present

## 2016-11-04 DIAGNOSIS — I10 Essential (primary) hypertension: Secondary | ICD-10-CM | POA: Diagnosis not present

## 2016-11-04 DIAGNOSIS — E119 Type 2 diabetes mellitus without complications: Secondary | ICD-10-CM | POA: Diagnosis not present

## 2016-11-04 DIAGNOSIS — G63 Polyneuropathy in diseases classified elsewhere: Secondary | ICD-10-CM | POA: Diagnosis not present

## 2016-11-04 DIAGNOSIS — E782 Mixed hyperlipidemia: Secondary | ICD-10-CM | POA: Diagnosis not present

## 2016-11-09 DIAGNOSIS — E113393 Type 2 diabetes mellitus with moderate nonproliferative diabetic retinopathy without macular edema, bilateral: Secondary | ICD-10-CM | POA: Diagnosis not present

## 2016-11-11 DIAGNOSIS — I10 Essential (primary) hypertension: Secondary | ICD-10-CM | POA: Diagnosis not present

## 2016-11-11 DIAGNOSIS — E782 Mixed hyperlipidemia: Secondary | ICD-10-CM | POA: Diagnosis not present

## 2016-11-11 DIAGNOSIS — E119 Type 2 diabetes mellitus without complications: Secondary | ICD-10-CM | POA: Diagnosis not present

## 2016-11-11 DIAGNOSIS — K644 Residual hemorrhoidal skin tags: Secondary | ICD-10-CM | POA: Diagnosis not present

## 2016-11-11 DIAGNOSIS — Z780 Asymptomatic menopausal state: Secondary | ICD-10-CM | POA: Diagnosis not present

## 2016-11-11 DIAGNOSIS — E538 Deficiency of other specified B group vitamins: Secondary | ICD-10-CM | POA: Diagnosis not present

## 2016-11-11 DIAGNOSIS — G319 Degenerative disease of nervous system, unspecified: Secondary | ICD-10-CM | POA: Diagnosis not present

## 2016-11-11 DIAGNOSIS — Z0000 Encounter for general adult medical examination without abnormal findings: Secondary | ICD-10-CM | POA: Diagnosis not present

## 2016-11-11 DIAGNOSIS — D510 Vitamin B12 deficiency anemia due to intrinsic factor deficiency: Secondary | ICD-10-CM | POA: Diagnosis not present

## 2016-11-24 ENCOUNTER — Ambulatory Visit
Admission: RE | Admit: 2016-11-24 | Discharge: 2016-11-24 | Disposition: A | Discharge status: Home / Self Care | Payer: PPO | Source: Ambulatory Visit | Attending: Physician Assistant | Admitting: Physician Assistant

## 2016-11-24 DIAGNOSIS — Z1231 Encounter for screening mammogram for malignant neoplasm of breast: Secondary | ICD-10-CM | POA: Diagnosis not present

## 2016-11-30 DIAGNOSIS — H353121 Nonexudative age-related macular degeneration, left eye, early dry stage: Secondary | ICD-10-CM | POA: Diagnosis not present

## 2016-11-30 DIAGNOSIS — H35373 Puckering of macula, bilateral: Secondary | ICD-10-CM | POA: Diagnosis not present

## 2016-11-30 DIAGNOSIS — H43813 Vitreous degeneration, bilateral: Secondary | ICD-10-CM | POA: Diagnosis not present

## 2016-11-30 DIAGNOSIS — H353211 Exudative age-related macular degeneration, right eye, with active choroidal neovascularization: Secondary | ICD-10-CM | POA: Diagnosis not present

## 2016-12-08 DIAGNOSIS — K648 Other hemorrhoids: Secondary | ICD-10-CM | POA: Diagnosis not present

## 2016-12-08 DIAGNOSIS — Z780 Asymptomatic menopausal state: Secondary | ICD-10-CM | POA: Diagnosis not present

## 2016-12-22 DIAGNOSIS — K648 Other hemorrhoids: Secondary | ICD-10-CM | POA: Diagnosis not present

## 2016-12-28 DIAGNOSIS — E538 Deficiency of other specified B group vitamins: Secondary | ICD-10-CM | POA: Diagnosis not present

## 2016-12-28 DIAGNOSIS — D510 Vitamin B12 deficiency anemia due to intrinsic factor deficiency: Secondary | ICD-10-CM | POA: Diagnosis not present

## 2017-01-11 DIAGNOSIS — Z96651 Presence of right artificial knee joint: Secondary | ICD-10-CM | POA: Diagnosis not present

## 2017-01-11 DIAGNOSIS — Z96652 Presence of left artificial knee joint: Secondary | ICD-10-CM | POA: Diagnosis not present

## 2017-01-28 DIAGNOSIS — I10 Essential (primary) hypertension: Secondary | ICD-10-CM | POA: Diagnosis not present

## 2017-01-28 DIAGNOSIS — E119 Type 2 diabetes mellitus without complications: Secondary | ICD-10-CM | POA: Diagnosis not present

## 2017-01-28 DIAGNOSIS — E782 Mixed hyperlipidemia: Secondary | ICD-10-CM | POA: Diagnosis not present

## 2017-01-28 DIAGNOSIS — D510 Vitamin B12 deficiency anemia due to intrinsic factor deficiency: Secondary | ICD-10-CM | POA: Diagnosis not present

## 2017-02-01 DIAGNOSIS — I10 Essential (primary) hypertension: Secondary | ICD-10-CM | POA: Diagnosis not present

## 2017-02-01 DIAGNOSIS — R5382 Chronic fatigue, unspecified: Secondary | ICD-10-CM | POA: Diagnosis not present

## 2017-02-01 DIAGNOSIS — I6523 Occlusion and stenosis of bilateral carotid arteries: Secondary | ICD-10-CM | POA: Diagnosis not present

## 2017-02-01 DIAGNOSIS — E119 Type 2 diabetes mellitus without complications: Secondary | ICD-10-CM | POA: Diagnosis not present

## 2017-02-01 DIAGNOSIS — E782 Mixed hyperlipidemia: Secondary | ICD-10-CM | POA: Diagnosis not present

## 2017-02-01 DIAGNOSIS — E538 Deficiency of other specified B group vitamins: Secondary | ICD-10-CM | POA: Diagnosis not present

## 2017-02-02 DIAGNOSIS — K648 Other hemorrhoids: Secondary | ICD-10-CM | POA: Diagnosis not present

## 2017-02-08 DIAGNOSIS — L851 Acquired keratosis [keratoderma] palmaris et plantaris: Secondary | ICD-10-CM | POA: Diagnosis not present

## 2017-02-08 DIAGNOSIS — E1142 Type 2 diabetes mellitus with diabetic polyneuropathy: Secondary | ICD-10-CM | POA: Diagnosis not present

## 2017-02-08 DIAGNOSIS — B351 Tinea unguium: Secondary | ICD-10-CM | POA: Diagnosis not present

## 2017-02-22 DIAGNOSIS — H35423 Microcystoid degeneration of retina, bilateral: Secondary | ICD-10-CM | POA: Diagnosis not present

## 2017-02-22 DIAGNOSIS — H43391 Other vitreous opacities, right eye: Secondary | ICD-10-CM | POA: Diagnosis not present

## 2017-02-22 DIAGNOSIS — H353121 Nonexudative age-related macular degeneration, left eye, early dry stage: Secondary | ICD-10-CM | POA: Diagnosis not present

## 2017-02-22 DIAGNOSIS — H353211 Exudative age-related macular degeneration, right eye, with active choroidal neovascularization: Secondary | ICD-10-CM | POA: Diagnosis not present

## 2017-03-08 DIAGNOSIS — D510 Vitamin B12 deficiency anemia due to intrinsic factor deficiency: Secondary | ICD-10-CM | POA: Diagnosis not present

## 2017-04-04 DIAGNOSIS — H43813 Vitreous degeneration, bilateral: Secondary | ICD-10-CM | POA: Diagnosis not present

## 2017-04-08 DIAGNOSIS — D510 Vitamin B12 deficiency anemia due to intrinsic factor deficiency: Secondary | ICD-10-CM | POA: Diagnosis not present

## 2017-04-27 DIAGNOSIS — H43813 Vitreous degeneration, bilateral: Secondary | ICD-10-CM | POA: Diagnosis not present

## 2017-05-11 DIAGNOSIS — D510 Vitamin B12 deficiency anemia due to intrinsic factor deficiency: Secondary | ICD-10-CM | POA: Diagnosis not present

## 2017-05-11 DIAGNOSIS — E113393 Type 2 diabetes mellitus with moderate nonproliferative diabetic retinopathy without macular edema, bilateral: Secondary | ICD-10-CM | POA: Diagnosis not present

## 2017-05-17 DIAGNOSIS — L600 Ingrowing nail: Secondary | ICD-10-CM | POA: Diagnosis not present

## 2017-05-17 DIAGNOSIS — B351 Tinea unguium: Secondary | ICD-10-CM | POA: Diagnosis not present

## 2017-05-17 DIAGNOSIS — E1142 Type 2 diabetes mellitus with diabetic polyneuropathy: Secondary | ICD-10-CM | POA: Diagnosis not present

## 2017-05-17 DIAGNOSIS — L851 Acquired keratosis [keratoderma] palmaris et plantaris: Secondary | ICD-10-CM | POA: Diagnosis not present

## 2017-05-31 DIAGNOSIS — I10 Essential (primary) hypertension: Secondary | ICD-10-CM | POA: Diagnosis not present

## 2017-05-31 DIAGNOSIS — E119 Type 2 diabetes mellitus without complications: Secondary | ICD-10-CM | POA: Diagnosis not present

## 2017-05-31 DIAGNOSIS — E782 Mixed hyperlipidemia: Secondary | ICD-10-CM | POA: Diagnosis not present

## 2017-05-31 DIAGNOSIS — E538 Deficiency of other specified B group vitamins: Secondary | ICD-10-CM | POA: Diagnosis not present

## 2017-06-07 DIAGNOSIS — E782 Mixed hyperlipidemia: Secondary | ICD-10-CM | POA: Diagnosis not present

## 2017-06-07 DIAGNOSIS — E119 Type 2 diabetes mellitus without complications: Secondary | ICD-10-CM | POA: Diagnosis not present

## 2017-06-07 DIAGNOSIS — D510 Vitamin B12 deficiency anemia due to intrinsic factor deficiency: Secondary | ICD-10-CM | POA: Diagnosis not present

## 2017-06-07 DIAGNOSIS — I10 Essential (primary) hypertension: Secondary | ICD-10-CM | POA: Diagnosis not present

## 2017-06-14 DIAGNOSIS — D510 Vitamin B12 deficiency anemia due to intrinsic factor deficiency: Secondary | ICD-10-CM | POA: Diagnosis not present

## 2017-06-21 DIAGNOSIS — H35423 Microcystoid degeneration of retina, bilateral: Secondary | ICD-10-CM | POA: Diagnosis not present

## 2017-06-21 DIAGNOSIS — H353122 Nonexudative age-related macular degeneration, left eye, intermediate dry stage: Secondary | ICD-10-CM | POA: Diagnosis not present

## 2017-06-21 DIAGNOSIS — H35373 Puckering of macula, bilateral: Secondary | ICD-10-CM | POA: Diagnosis not present

## 2017-06-21 DIAGNOSIS — H353212 Exudative age-related macular degeneration, right eye, with inactive choroidal neovascularization: Secondary | ICD-10-CM | POA: Diagnosis not present

## 2017-07-15 DIAGNOSIS — E538 Deficiency of other specified B group vitamins: Secondary | ICD-10-CM | POA: Diagnosis not present

## 2017-07-15 DIAGNOSIS — D510 Vitamin B12 deficiency anemia due to intrinsic factor deficiency: Secondary | ICD-10-CM | POA: Diagnosis not present

## 2017-08-03 DIAGNOSIS — L821 Other seborrheic keratosis: Secondary | ICD-10-CM | POA: Diagnosis not present

## 2017-08-03 DIAGNOSIS — L820 Inflamed seborrheic keratosis: Secondary | ICD-10-CM | POA: Diagnosis not present

## 2017-08-03 DIAGNOSIS — L578 Other skin changes due to chronic exposure to nonionizing radiation: Secondary | ICD-10-CM | POA: Diagnosis not present

## 2017-08-16 DIAGNOSIS — D510 Vitamin B12 deficiency anemia due to intrinsic factor deficiency: Secondary | ICD-10-CM | POA: Diagnosis not present

## 2017-08-17 DIAGNOSIS — B351 Tinea unguium: Secondary | ICD-10-CM | POA: Diagnosis not present

## 2017-08-17 DIAGNOSIS — L851 Acquired keratosis [keratoderma] palmaris et plantaris: Secondary | ICD-10-CM | POA: Diagnosis not present

## 2017-08-17 DIAGNOSIS — E1142 Type 2 diabetes mellitus with diabetic polyneuropathy: Secondary | ICD-10-CM | POA: Diagnosis not present

## 2017-08-19 DIAGNOSIS — Z23 Encounter for immunization: Secondary | ICD-10-CM | POA: Diagnosis not present

## 2017-09-14 DIAGNOSIS — L820 Inflamed seborrheic keratosis: Secondary | ICD-10-CM | POA: Diagnosis not present

## 2017-09-14 DIAGNOSIS — L821 Other seborrheic keratosis: Secondary | ICD-10-CM | POA: Diagnosis not present

## 2017-09-16 DIAGNOSIS — D510 Vitamin B12 deficiency anemia due to intrinsic factor deficiency: Secondary | ICD-10-CM | POA: Diagnosis not present

## 2017-09-20 DIAGNOSIS — H353212 Exudative age-related macular degeneration, right eye, with inactive choroidal neovascularization: Secondary | ICD-10-CM | POA: Diagnosis not present

## 2017-09-21 DIAGNOSIS — E113393 Type 2 diabetes mellitus with moderate nonproliferative diabetic retinopathy without macular edema, bilateral: Secondary | ICD-10-CM | POA: Diagnosis not present

## 2017-10-15 DIAGNOSIS — L821 Other seborrheic keratosis: Secondary | ICD-10-CM | POA: Diagnosis not present

## 2017-10-15 DIAGNOSIS — L820 Inflamed seborrheic keratosis: Secondary | ICD-10-CM | POA: Diagnosis not present

## 2017-10-17 ENCOUNTER — Other Ambulatory Visit: Payer: Self-pay | Admitting: Physician Assistant

## 2017-10-17 DIAGNOSIS — Z1231 Encounter for screening mammogram for malignant neoplasm of breast: Secondary | ICD-10-CM

## 2017-10-18 DIAGNOSIS — D510 Vitamin B12 deficiency anemia due to intrinsic factor deficiency: Secondary | ICD-10-CM | POA: Diagnosis not present

## 2017-10-26 DIAGNOSIS — L820 Inflamed seborrheic keratosis: Secondary | ICD-10-CM | POA: Diagnosis not present

## 2017-10-26 DIAGNOSIS — L821 Other seborrheic keratosis: Secondary | ICD-10-CM | POA: Diagnosis not present

## 2017-10-31 DIAGNOSIS — K648 Other hemorrhoids: Secondary | ICD-10-CM | POA: Diagnosis not present

## 2017-11-09 DIAGNOSIS — K648 Other hemorrhoids: Secondary | ICD-10-CM | POA: Diagnosis not present

## 2017-11-10 DIAGNOSIS — I10 Essential (primary) hypertension: Secondary | ICD-10-CM | POA: Diagnosis not present

## 2017-11-10 DIAGNOSIS — E782 Mixed hyperlipidemia: Secondary | ICD-10-CM | POA: Diagnosis not present

## 2017-11-10 DIAGNOSIS — E119 Type 2 diabetes mellitus without complications: Secondary | ICD-10-CM | POA: Diagnosis not present

## 2017-11-16 DIAGNOSIS — E1142 Type 2 diabetes mellitus with diabetic polyneuropathy: Secondary | ICD-10-CM | POA: Diagnosis not present

## 2017-11-16 DIAGNOSIS — B351 Tinea unguium: Secondary | ICD-10-CM | POA: Diagnosis not present

## 2017-11-16 DIAGNOSIS — L851 Acquired keratosis [keratoderma] palmaris et plantaris: Secondary | ICD-10-CM | POA: Diagnosis not present

## 2017-11-17 DIAGNOSIS — K529 Noninfective gastroenteritis and colitis, unspecified: Secondary | ICD-10-CM | POA: Diagnosis not present

## 2017-11-17 DIAGNOSIS — I10 Essential (primary) hypertension: Secondary | ICD-10-CM | POA: Diagnosis not present

## 2017-11-17 DIAGNOSIS — I6523 Occlusion and stenosis of bilateral carotid arteries: Secondary | ICD-10-CM | POA: Diagnosis not present

## 2017-11-17 DIAGNOSIS — Z0000 Encounter for general adult medical examination without abnormal findings: Secondary | ICD-10-CM | POA: Diagnosis not present

## 2017-11-17 DIAGNOSIS — E119 Type 2 diabetes mellitus without complications: Secondary | ICD-10-CM | POA: Diagnosis not present

## 2017-11-17 DIAGNOSIS — E538 Deficiency of other specified B group vitamins: Secondary | ICD-10-CM | POA: Diagnosis not present

## 2017-11-17 DIAGNOSIS — R5382 Chronic fatigue, unspecified: Secondary | ICD-10-CM | POA: Diagnosis not present

## 2017-11-17 DIAGNOSIS — M1990 Unspecified osteoarthritis, unspecified site: Secondary | ICD-10-CM | POA: Diagnosis not present

## 2017-11-17 DIAGNOSIS — G63 Polyneuropathy in diseases classified elsewhere: Secondary | ICD-10-CM | POA: Diagnosis not present

## 2017-11-18 DIAGNOSIS — K529 Noninfective gastroenteritis and colitis, unspecified: Secondary | ICD-10-CM | POA: Diagnosis not present

## 2017-11-18 DIAGNOSIS — D510 Vitamin B12 deficiency anemia due to intrinsic factor deficiency: Secondary | ICD-10-CM | POA: Diagnosis not present

## 2017-11-25 ENCOUNTER — Ambulatory Visit
Admission: RE | Admit: 2017-11-25 | Discharge: 2017-11-25 | Disposition: A | Discharge status: Home / Self Care | Payer: PPO | Source: Ambulatory Visit | Attending: Physician Assistant | Admitting: Physician Assistant

## 2017-11-25 DIAGNOSIS — Z1231 Encounter for screening mammogram for malignant neoplasm of breast: Secondary | ICD-10-CM | POA: Diagnosis not present

## 2017-12-08 DIAGNOSIS — N819 Female genital prolapse, unspecified: Secondary | ICD-10-CM | POA: Diagnosis not present

## 2017-12-08 DIAGNOSIS — E119 Type 2 diabetes mellitus without complications: Secondary | ICD-10-CM | POA: Diagnosis not present

## 2017-12-08 DIAGNOSIS — J0100 Acute maxillary sinusitis, unspecified: Secondary | ICD-10-CM | POA: Diagnosis not present

## 2017-12-08 DIAGNOSIS — E782 Mixed hyperlipidemia: Secondary | ICD-10-CM | POA: Diagnosis not present

## 2017-12-08 DIAGNOSIS — I10 Essential (primary) hypertension: Secondary | ICD-10-CM | POA: Diagnosis not present

## 2017-12-08 DIAGNOSIS — N8111 Cystocele, midline: Secondary | ICD-10-CM | POA: Diagnosis not present

## 2017-12-13 DIAGNOSIS — M79672 Pain in left foot: Secondary | ICD-10-CM | POA: Diagnosis not present

## 2017-12-13 DIAGNOSIS — M2041 Other hammer toe(s) (acquired), right foot: Secondary | ICD-10-CM | POA: Diagnosis not present

## 2017-12-13 DIAGNOSIS — M216X2 Other acquired deformities of left foot: Secondary | ICD-10-CM | POA: Diagnosis not present

## 2017-12-13 DIAGNOSIS — M2042 Other hammer toe(s) (acquired), left foot: Secondary | ICD-10-CM | POA: Diagnosis not present

## 2017-12-14 DIAGNOSIS — K648 Other hemorrhoids: Secondary | ICD-10-CM | POA: Diagnosis not present

## 2017-12-14 DIAGNOSIS — R197 Diarrhea, unspecified: Secondary | ICD-10-CM | POA: Diagnosis not present

## 2017-12-15 ENCOUNTER — Other Ambulatory Visit
Admission: RE | Admit: 2017-12-15 | Discharge: 2017-12-15 | Disposition: A | Discharge status: Home / Self Care | Payer: PPO | Source: Ambulatory Visit | Attending: Nurse Practitioner | Admitting: Nurse Practitioner

## 2017-12-15 DIAGNOSIS — R197 Diarrhea, unspecified: Secondary | ICD-10-CM | POA: Diagnosis not present

## 2017-12-15 DIAGNOSIS — Z01810 Encounter for preprocedural cardiovascular examination: Secondary | ICD-10-CM | POA: Diagnosis not present

## 2017-12-15 LAB — GASTROINTESTINAL PANEL BY PCR, STOOL (REPLACES STOOL CULTURE)

## 2017-12-16 LAB — PANCREATIC ELASTASE, FECAL: Pancreatic Elastase-1, Stool: >500 ug Elast./g (ref >200)

## 2017-12-20 DIAGNOSIS — D510 Vitamin B12 deficiency anemia due to intrinsic factor deficiency: Secondary | ICD-10-CM | POA: Diagnosis not present

## 2017-12-22 ENCOUNTER — Encounter: Payer: Self-pay | Admitting: *Deleted

## 2017-12-23 ENCOUNTER — Encounter
Admission: RE | Disposition: A | Discharge status: Home / Self Care | Payer: Self-pay | Source: Ambulatory Visit | Attending: Unknown Physician Specialty

## 2017-12-23 ENCOUNTER — Ambulatory Visit: Payer: PPO | Admitting: Anesthesiology

## 2017-12-23 ENCOUNTER — Encounter: Payer: Self-pay | Admitting: *Deleted

## 2017-12-23 ENCOUNTER — Ambulatory Visit
Admission: RE | Admit: 2017-12-23 | Discharge: 2017-12-23 | Disposition: A | Discharge status: Home / Self Care | Payer: PPO | Source: Ambulatory Visit | Attending: Unknown Physician Specialty | Admitting: Unknown Physician Specialty

## 2017-12-23 DIAGNOSIS — Z9889 Other specified postprocedural states: Secondary | ICD-10-CM | POA: Diagnosis not present

## 2017-12-23 DIAGNOSIS — E538 Deficiency of other specified B group vitamins: Secondary | ICD-10-CM | POA: Diagnosis not present

## 2017-12-23 DIAGNOSIS — R197 Diarrhea, unspecified: Secondary | ICD-10-CM | POA: Diagnosis not present

## 2017-12-23 DIAGNOSIS — Z885 Allergy status to narcotic agent status: Secondary | ICD-10-CM | POA: Insufficient documentation

## 2017-12-23 DIAGNOSIS — Z7951 Long term (current) use of inhaled steroids: Secondary | ICD-10-CM | POA: Diagnosis not present

## 2017-12-23 DIAGNOSIS — K640 First degree hemorrhoids: Secondary | ICD-10-CM | POA: Diagnosis not present

## 2017-12-23 DIAGNOSIS — Z8249 Family history of ischemic heart disease and other diseases of the circulatory system: Secondary | ICD-10-CM | POA: Insufficient documentation

## 2017-12-23 DIAGNOSIS — Z9841 Cataract extraction status, right eye: Secondary | ICD-10-CM | POA: Diagnosis not present

## 2017-12-23 DIAGNOSIS — Z9842 Cataract extraction status, left eye: Secondary | ICD-10-CM | POA: Diagnosis not present

## 2017-12-23 DIAGNOSIS — K580 Irritable bowel syndrome with diarrhea: Secondary | ICD-10-CM | POA: Insufficient documentation

## 2017-12-23 DIAGNOSIS — D649 Anemia, unspecified: Secondary | ICD-10-CM | POA: Insufficient documentation

## 2017-12-23 DIAGNOSIS — Z79899 Other long term (current) drug therapy: Secondary | ICD-10-CM | POA: Insufficient documentation

## 2017-12-23 DIAGNOSIS — Z7982 Long term (current) use of aspirin: Secondary | ICD-10-CM | POA: Insufficient documentation

## 2017-12-23 DIAGNOSIS — Z800 Family history of malignant neoplasm of digestive organs: Secondary | ICD-10-CM | POA: Insufficient documentation

## 2017-12-23 DIAGNOSIS — Z823 Family history of stroke: Secondary | ICD-10-CM | POA: Insufficient documentation

## 2017-12-23 DIAGNOSIS — E1140 Type 2 diabetes mellitus with diabetic neuropathy, unspecified: Secondary | ICD-10-CM | POA: Insufficient documentation

## 2017-12-23 DIAGNOSIS — Z881 Allergy status to other antibiotic agents status: Secondary | ICD-10-CM | POA: Insufficient documentation

## 2017-12-23 DIAGNOSIS — Z8262 Family history of osteoporosis: Secondary | ICD-10-CM | POA: Insufficient documentation

## 2017-12-23 DIAGNOSIS — Z8349 Family history of other endocrine, nutritional and metabolic diseases: Secondary | ICD-10-CM | POA: Insufficient documentation

## 2017-12-23 DIAGNOSIS — I10 Essential (primary) hypertension: Secondary | ICD-10-CM | POA: Diagnosis not present

## 2017-12-23 DIAGNOSIS — Z833 Family history of diabetes mellitus: Secondary | ICD-10-CM | POA: Insufficient documentation

## 2017-12-23 DIAGNOSIS — E782 Mixed hyperlipidemia: Secondary | ICD-10-CM | POA: Insufficient documentation

## 2017-12-23 DIAGNOSIS — G5600 Carpal tunnel syndrome, unspecified upper limb: Secondary | ICD-10-CM | POA: Diagnosis not present

## 2017-12-23 DIAGNOSIS — H9190 Unspecified hearing loss, unspecified ear: Secondary | ICD-10-CM | POA: Insufficient documentation

## 2017-12-23 DIAGNOSIS — I081 Rheumatic disorders of both mitral and tricuspid valves: Secondary | ICD-10-CM | POA: Insufficient documentation

## 2017-12-23 DIAGNOSIS — K449 Diaphragmatic hernia without obstruction or gangrene: Secondary | ICD-10-CM | POA: Insufficient documentation

## 2017-12-23 DIAGNOSIS — E890 Postprocedural hypothyroidism: Secondary | ICD-10-CM | POA: Insufficient documentation

## 2017-12-23 DIAGNOSIS — Z90710 Acquired absence of both cervix and uterus: Secondary | ICD-10-CM | POA: Diagnosis not present

## 2017-12-23 DIAGNOSIS — K219 Gastro-esophageal reflux disease without esophagitis: Secondary | ICD-10-CM | POA: Diagnosis not present

## 2017-12-23 DIAGNOSIS — D122 Benign neoplasm of ascending colon: Secondary | ICD-10-CM | POA: Diagnosis not present

## 2017-12-23 DIAGNOSIS — Z96653 Presence of artificial knee joint, bilateral: Secondary | ICD-10-CM | POA: Insufficient documentation

## 2017-12-23 DIAGNOSIS — M1990 Unspecified osteoarthritis, unspecified site: Secondary | ICD-10-CM | POA: Diagnosis not present

## 2017-12-23 DIAGNOSIS — Z7984 Long term (current) use of oral hypoglycemic drugs: Secondary | ICD-10-CM | POA: Diagnosis not present

## 2017-12-23 DIAGNOSIS — H3530 Unspecified macular degeneration: Secondary | ICD-10-CM | POA: Diagnosis not present

## 2017-12-23 DIAGNOSIS — Z1211 Encounter for screening for malignant neoplasm of colon: Secondary | ICD-10-CM | POA: Diagnosis not present

## 2017-12-23 DIAGNOSIS — Z9049 Acquired absence of other specified parts of digestive tract: Secondary | ICD-10-CM | POA: Insufficient documentation

## 2017-12-23 DIAGNOSIS — Z8049 Family history of malignant neoplasm of other genital organs: Secondary | ICD-10-CM | POA: Insufficient documentation

## 2017-12-23 DIAGNOSIS — K635 Polyp of colon: Secondary | ICD-10-CM | POA: Diagnosis not present

## 2017-12-23 DIAGNOSIS — Z886 Allergy status to analgesic agent status: Secondary | ICD-10-CM | POA: Insufficient documentation

## 2017-12-23 HISTORY — DX: Unspecified osteoarthritis, unspecified site: M19.90

## 2017-12-23 HISTORY — DX: Gastro-esophageal reflux disease without esophagitis: K21.9

## 2017-12-23 HISTORY — DX: Carpal tunnel syndrome, unspecified upper limb: G56.00

## 2017-12-23 HISTORY — DX: Unspecified macular degeneration: H35.30

## 2017-12-23 HISTORY — PX: COLONOSCOPY WITH PROPOFOL: SHX5780

## 2017-12-23 HISTORY — DX: Deficiency of other specified B group vitamins: E53.8

## 2017-12-23 HISTORY — DX: Cardiomegaly: I51.7

## 2017-12-23 HISTORY — DX: Polyneuropathy, unspecified: G62.9

## 2017-12-23 HISTORY — DX: Irritable bowel syndrome, unspecified: K58.9

## 2017-12-23 HISTORY — DX: Other forms of stomatitis: K12.1

## 2017-12-23 HISTORY — DX: Diarrhea, unspecified: R19.7

## 2017-12-23 HISTORY — DX: Diaphragmatic hernia without obstruction or gangrene: K44.9

## 2017-12-23 HISTORY — DX: Essential (primary) hypertension: I10

## 2017-12-23 HISTORY — DX: Unspecified hearing loss, unspecified ear: H91.90

## 2017-12-23 HISTORY — DX: Anemia, unspecified: D64.9

## 2017-12-23 HISTORY — DX: Nonrheumatic mitral (valve) insufficiency: I34.0

## 2017-12-23 LAB — GLUCOSE, CAPILLARY: GLUCOSE-CAPILLARY: 174 mg/dL — AB (ref 65–99)

## 2017-12-23 SURGERY — COLONOSCOPY WITH PROPOFOL
Anesthesia: General

## 2017-12-23 MED ORDER — EPHEDRINE SULFATE 50 MG/ML IJ SOLN
INTRAMUSCULAR | Status: DC | PRN
  Administered 2017-12-23: 15 mg via INTRAVENOUS

## 2017-12-23 MED ORDER — PROPOFOL 10 MG/ML IV BOLUS
INTRAVENOUS | Status: DC | PRN
  Administered 2017-12-23: 20 mg via INTRAVENOUS
  Administered 2017-12-23: 40 mg via INTRAVENOUS

## 2017-12-23 MED ORDER — SODIUM CHLORIDE 0.9 % IV SOLN: INTRAVENOUS | Status: DC

## 2017-12-23 MED ORDER — PROPOFOL 500 MG/50ML IV EMUL
INTRAVENOUS | Status: DC | PRN
  Administered 2017-12-23: 140 ug/kg/min via INTRAVENOUS

## 2017-12-23 MED ORDER — LIDOCAINE HCL (PF) 1 % IJ SOLN
2.0000 mL | Freq: Once | INTRAMUSCULAR | Status: AC
  Administered 2017-12-23: 0.3 mL via INTRADERMAL

## 2017-12-23 MED ORDER — SODIUM CHLORIDE 0.9 % IV SOLN
INTRAVENOUS | Status: DC
  Administered 2017-12-23: 1000 mL via INTRAVENOUS
  Administered 2017-12-23: 11:00:00 via INTRAVENOUS

## 2017-12-23 MED ORDER — MIDAZOLAM HCL 2 MG/2ML IJ SOLN
INTRAMUSCULAR | Status: AC
  Filled 2017-12-23: qty 2

## 2017-12-23 MED ORDER — LIDOCAINE HCL (PF) 2 % IJ SOLN
INTRAMUSCULAR | Status: AC
  Filled 2017-12-23: qty 10

## 2017-12-23 MED ORDER — PROPOFOL 500 MG/50ML IV EMUL
INTRAVENOUS | Status: AC
Start: 2017-12-23 — End: 2017-12-23
  Filled 2017-12-23: qty 50

## 2017-12-23 MED ORDER — LIDOCAINE HCL (PF) 1 % IJ SOLN
INTRAMUSCULAR | Status: AC
  Administered 2017-12-23: 0.3 mL via INTRADERMAL
  Filled 2017-12-23: qty 2

## 2017-12-23 MED ORDER — FENTANYL CITRATE (PF) 100 MCG/2ML IJ SOLN
INTRAMUSCULAR | Status: AC
  Filled 2017-12-23: qty 2

## 2017-12-23 MED ORDER — PIPERACILLIN-TAZOBACTAM 3.375 G IVPB 30 MIN
3.3750 g | Freq: Once | INTRAVENOUS | Status: AC
  Administered 2017-12-23: 3.375 g via INTRAVENOUS
  Filled 2017-12-23: qty 50

## 2017-12-23 MED ORDER — MIDAZOLAM HCL 2 MG/2ML IJ SOLN
INTRAMUSCULAR | Status: DC | PRN
  Administered 2017-12-23: .5 mg via INTRAVENOUS

## 2017-12-23 MED ORDER — LIDOCAINE HCL (CARDIAC) 20 MG/ML IV SOLN
INTRAVENOUS | Status: DC | PRN
  Administered 2017-12-23: 40 mg via INTRATRACHEAL

## 2017-12-23 MED ORDER — FENTANYL CITRATE (PF) 100 MCG/2ML IJ SOLN
INTRAMUSCULAR | Status: DC | PRN
  Administered 2017-12-23: 25 ug via INTRAVENOUS

## 2017-12-23 NOTE — Anesthesia Postprocedure Evaluation (Signed)
Anesthesia Post Note  Patient: Laura Mcdaniel  Procedure(s) Performed: COLONOSCOPY WITH PROPOFOL (N/A )  Patient location during evaluation: Endoscopy Anesthesia Type: General Level of consciousness: awake and alert Pain management: pain level controlled Vital Signs Assessment: post-procedure vital signs reviewed and stable Respiratory status: spontaneous breathing and respiratory function stable Cardiovascular status: stable Anesthetic complications: no     Last Vitals:  Vitals:   12/23/17 1220 12/23/17 1230  BP: 131/68 130/71  Pulse: (!) 101 93  Resp: (!) 21 16  Temp:    SpO2: 100% 99%    Last Pain:  Vitals:   12/23/17 1025  TempSrc: Tympanic  PainSc: 5                  KEPHART,WILLIAM K

## 2017-12-23 NOTE — Anesthesia Post-op Follow-up Note (Signed)
Anesthesia QCDR form completed.        

## 2017-12-23 NOTE — Op Note (Signed)
[facility] Gastroenterology Patient Name: Laura Mcdaniel Procedure Date: 12/23/2017 11:28 AM MRN: [id number] Account #: [id number] Date of Birth: 02-09-1946 Admit Type: Outpatient Age: 72 Room: [facility] ENDO ROOM 3 Gender: Female Note Status: Finalized Procedure:            Colonoscopy Indications:          Clinically significant diarrhea of unexplained origin Providers:            Manya Silvas, MD Referring MD:         Rusty Aus, MD (Referring MD) Medicines:            Propofol per Anesthesia Complications:        No immediate complications. Procedure:            Pre-Anesthesia Assessment:                       - After reviewing the risks and benefits, the patient                        was deemed in satisfactory condition to undergo the                        procedure.                       After obtaining informed consent, the colonoscope was                        passed under direct vision. Throughout the procedure,                        the patient's blood pressure, pulse, and oxygen                        saturations were monitored continuously. The                        Colonoscope was introduced through the anus and                        advanced to the the cecum, identified by appendiceal                        orifice and ileocecal valve. The colonoscopy was                        performed without difficulty. The patient tolerated the                        procedure well. The quality of the bowel preparation                        was excellent. Findings:      A diminutive polyp was found in the ascending colon. The polyp was       sessile. The polyp was removed with a cold snare. Resection and       retrieval were complete.      Internal hemorrhoids were found during endoscopy. The hemorrhoids were       small and Grade I (internal hemorrhoids that do not prolapse).      The exam  was otherwise without abnormality. Due to diarrhea I  biopsied       the colon lining at ascending, transverse, descending and sigmoid colon.       Every where the colon lining was normal. Impression:           - One diminutive polyp in the ascending colon, removed                        with a cold snare. Resected and retrieved.                       - Internal hemorrhoids.                       - The examination was otherwise normal. Recommendation:       - Await pathology results. Manya Silvas, MD 12/23/2017 12:08:31 PM This report has been signed electronically. Number of Addenda: 0 Note Initiated On: 12/23/2017 11:28 AM Scope Withdrawal Time: 0 hours 11 minutes 52 seconds  Total Procedure Duration: 0 hours 26 minutes 20 seconds       [facility]

## 2017-12-23 NOTE — H&P (Signed)
Primary Care Physician:  Rusty Aus, MD Primary Gastroenterologist:  Dr. Vira Agar  Pre-Procedure History & Physical: HPI:  Laura Mcdaniel is a 72 y.o. female is here for an colonoscopy.   Past Medical History:  Diagnosis Date  . Anemia   . B12 deficiency   . Carpal tunnel syndrome   . Diabetes mellitus without complication ([place])   . Diarrhea   . Difficult intubation   . DJD (degenerative joint disease)   . Enlarged RV (right ventricle)   . GERD (gastroesophageal reflux disease)   . Hearing loss   . Hiatal hernia   . Hypertension   . IBS (irritable bowel syndrome)   . Macular degeneration   . Mitral valve regurgitation   . Peripheral neuropathy   . Stomatitis     Past Surgical History:  Procedure Laterality Date  . ABDOMINAL HYSTERECTOMY    . APPENDECTOMY    . carpel tunnel Bilateral   . CATARACT EXTRACTION Bilateral   . EYE SURGERY     bilateral  . JOINT REPLACEMENT     left knee2/9/09 right knee 2017  . KNEE ARTHROPLASTY Right 01/19/2016   Procedure: COMPUTER ASSISTED TOTAL KNEE ARTHROPLASTY;  Surgeon: Dereck Leep, MD;  Location: [facility] ORS;  Service: Orthopedics;  Laterality: Right;  . left knee surgery x2    . Left total knee arthroplasty  2009   Dr. Marry Guan  . SHOULDER SURGERY Left   . THYROIDECTOMY    . TONSILLECTOMY      Prior to Admission medications   Medication Sig Start Date End Date Taking? Authorizing Provider  ferrous sulfate 325 (65 FE) MG tablet Take 325 mg by mouth daily.   Yes [provider]  acetaminophen (TYLENOL) 500 MG tablet Take 1,000 mg by mouth every 6 (six) hours as needed (for pain.).    [provider]  Aflibercept (EYLEA IO) Inject 1 Syringe into the eye as directed. Per ophthamology Office for macular degeneration    [provider]  aspirin EC 81 MG tablet Take 81 mg by mouth at bedtime.    [provider]  atorvastatin (LIPITOR) 40 MG tablet Take 40 mg by mouth at bedtime.     [provider]  Calcium Carb-Cholecalciferol (CALCIUM 600+D3 PO) Take 1 tablet by mouth 2 (two) times daily.    [provider]  fluticasone (FLONASE) 50 MCG/ACT nasal spray Place 1-2 sprays into the nose daily as needed. For allergies. 01/24/17   [provider]  gabapentin (NEURONTIN) 300 MG capsule Take 300 mg by mouth 2 (two) times daily.    [provider]  glimepiride (AMARYL) 1 MG tablet Take 1 mg by mouth 2 (two) times daily.     [provider]  hydrochlorothiazide (HYDRODIURIL) 25 MG tablet Take 25 mg by mouth daily.     [provider]  lisinopril (PRINIVIL,ZESTRIL) 10 MG tablet Take 10 mg by mouth daily.     [provider]  metFORMIN (GLUCOPHAGE) 1000 MG tablet Take 1,000 mg by mouth 2 (two) times daily. Take 1 tablet (1000 mg) in the morning & 1.5 tablets (1500 mg) at night    [provider]  Multiple Vitamin (MULTIVITAMIN WITH MINERALS) TABS tablet Take 1 tablet by mouth daily.    [provider]  Multiple Vitamins-Minerals (PRESERVISION AREDS 2 PO) Take 1 tablet by mouth 2 (two) times daily.    [provider]  naproxen sodium (ALEVE) 220 MG tablet Take 220 mg by mouth  2 (two) times daily as needed (for pain.).    [provider]  omeprazole (PRILOSEC) 20 MG capsule Take 20 mg by mouth daily before breakfast. as needed     [provider]  tobramycin (TOBREX) 0.3 % ophthalmic solution Apply 1 drop to eye as directed. Use after eylea eye injections as instructed. 07/27/16   [provider]  vitamin C (ASCORBIC ACID) 500 MG tablet Take 500 mg by mouth daily.    [provider]  vitamin E 400 UNIT capsule Take 400 Units by mouth daily.    [provider]    Allergies as of 12/16/2017  . (No Known Allergies)    History reviewed. No pertinent family history.  Social History   Socioeconomic History  . Marital status: Married    Spouse name: Not on file  .  Number of children: Not on file  . Years of education: Not on file  . Highest education level: Not on file  Social Needs  . Financial resource strain: Not on file  . Food insecurity - worry: Not on file  . Food insecurity - inability: Not on file  . Transportation needs - medical: Not on file  . Transportation needs - non-medical: Not on file  Occupational History  . Not on file  Tobacco Use  . Smoking status: Never Smoker  . Smokeless tobacco: Never Used  Substance and Sexual Activity  . Alcohol use: No  . Drug use: No  . Sexual activity: Not on file  Other Topics Concern  . Not on file  Social History Narrative  . Not on file    Review of Systems: See HPI, otherwise negative ROS  Physical Exam: BP 127/64   Pulse 98   Temp 97.9 F (36.6 C) (Tympanic)   Resp 17   Ht 5\' 3"  (1.6 m)   Wt 78.5 kg (173 lb)   SpO2 99%   BMI 30.65 kg/m  General:   Alert,  pleasant and cooperative in NAD Head:  Normocephalic and atraumatic. Neck:  Supple; no masses or thyromegaly. Lungs:  Clear throughout to auscultation.    Heart:  Regular rate and rhythm. Abdomen:  Soft, nontender and nondistended. Normal bowel sounds, without guarding, and without rebound.   Neurologic:  Alert and  oriented x4;  grossly normal neurologically.  Impression/Plan: Laura Mcdaniel is here for an colonoscopy to be performed for diarrhea.  Risks, benefits, limitations, and alternatives regarding  colonoscopy have been reviewed with the patient.  Questions have been answered.  All parties agreeable.   Gaylyn Cheers, MD  12/23/2017, 11:26 AM

## 2017-12-23 NOTE — Anesthesia Procedure Notes (Signed)
Date/Time: 12/23/2017 11:30 AM Performed by: Allean Found, CRNA Pre-anesthesia Checklist: Patient identified, Emergency Drugs available, Suction available, Patient being monitored and Timeout performed Patient Re-evaluated:Patient Re-evaluated prior to induction Oxygen Delivery Method: Nasal cannula Placement Confirmation: positive ETCO2 Dental Injury: Teeth and Oropharynx as per pre-operative assessment

## 2017-12-23 NOTE — Anesthesia Preprocedure Evaluation (Signed)
Anesthesia Evaluation  Patient identified by MRN, date of birth, ID band Patient awake    Reviewed: Allergy & Precautions, NPO status , Patient's Chart, lab work & pertinent test results  History of Anesthesia Complications (+) PONV, DIFFICULT AIRWAY and history of anesthetic complications (difficult intubation years ago)  Airway Mallampati: III       Dental   Pulmonary neg sleep apnea, neg COPD,           Cardiovascular hypertension, Pt. on medications (-) Past MI and (-) CHF (-) dysrhythmias (-) Valvular Problems/Murmurs     Neuro/Psych neg Seizures    GI/Hepatic Neg liver ROS, hiatal hernia, GERD  Medicated and Controlled,  Endo/Other  diabetes, Type 2, Oral Hypoglycemic Agents  Renal/GU negative Renal ROS     Musculoskeletal   Abdominal   Peds  Hematology  (+) anemia ,   Anesthesia Other Findings   Reproductive/Obstetrics                             Anesthesia Physical Anesthesia Plan  ASA: III  Anesthesia Plan: General   Post-op Pain Management:    Induction: Intravenous  PONV Risk Score and Plan: 3 and TIVA, Propofol infusion and Ondansetron  Airway Management Planned: Nasal Cannula  Additional Equipment:   Intra-op Plan:   Post-operative Plan:   Informed Consent: I have reviewed the patients History and Physical, chart, labs and discussed the procedure including the risks, benefits and alternatives for the proposed anesthesia with the patient or authorized representative who has indicated his/her understanding and acceptance.     Plan Discussed with:   Anesthesia Plan Comments:         Anesthesia Quick Evaluation

## 2017-12-23 NOTE — Transfer of Care (Signed)
Immediate Anesthesia Transfer of Care Note  Patient: Laura Mcdaniel  Procedure(s) Performed: COLONOSCOPY WITH PROPOFOL (N/A )  Patient Location: PACU  Anesthesia Type:General  Level of Consciousness: awake  Airway & Oxygen Therapy: Patient Spontanous Breathing and Patient connected to nasal cannula oxygen  Post-op Assessment: Report given to RN and Post -op Vital signs reviewed and stable  Post vital signs: Reviewed and stable  Last Vitals:  Vitals:   12/23/17 1026 12/23/17 1210  BP: 127/64 115/72  Pulse: 98 74  Resp:    Temp:  (!) 35.6 C  SpO2: 99% 99%    Last Pain:  Vitals:   12/23/17 1025  TempSrc: Tympanic  PainSc: 5          Complications: No apparent anesthesia complications

## 2017-12-26 ENCOUNTER — Encounter: Payer: Self-pay | Admitting: Unknown Physician Specialty

## 2017-12-26 LAB — SURGICAL PATHOLOGY

## 2017-12-27 DIAGNOSIS — H43813 Vitreous degeneration, bilateral: Secondary | ICD-10-CM | POA: Diagnosis not present

## 2017-12-27 DIAGNOSIS — H353122 Nonexudative age-related macular degeneration, left eye, intermediate dry stage: Secondary | ICD-10-CM | POA: Diagnosis not present

## 2017-12-27 DIAGNOSIS — H353212 Exudative age-related macular degeneration, right eye, with inactive choroidal neovascularization: Secondary | ICD-10-CM | POA: Diagnosis not present

## 2017-12-27 DIAGNOSIS — H35373 Puckering of macula, bilateral: Secondary | ICD-10-CM | POA: Diagnosis not present

## 2017-12-28 ENCOUNTER — Other Ambulatory Visit: Payer: Self-pay | Admitting: Podiatry

## 2017-12-28 ENCOUNTER — Other Ambulatory Visit: Payer: Self-pay

## 2017-12-28 ENCOUNTER — Encounter
Admission: RE | Admit: 2017-12-28 | Discharge: 2017-12-28 | Disposition: A | Discharge status: Home / Self Care | Payer: PPO | Source: Ambulatory Visit | Attending: Podiatry | Admitting: Podiatry

## 2017-12-28 DIAGNOSIS — M79672 Pain in left foot: Secondary | ICD-10-CM | POA: Insufficient documentation

## 2017-12-28 DIAGNOSIS — M2042 Other hammer toe(s) (acquired), left foot: Secondary | ICD-10-CM | POA: Insufficient documentation

## 2017-12-28 DIAGNOSIS — M2041 Other hammer toe(s) (acquired), right foot: Secondary | ICD-10-CM | POA: Diagnosis not present

## 2017-12-28 DIAGNOSIS — Z01818 Encounter for other preprocedural examination: Secondary | ICD-10-CM | POA: Insufficient documentation

## 2017-12-28 DIAGNOSIS — M216X2 Other acquired deformities of left foot: Secondary | ICD-10-CM | POA: Diagnosis not present

## 2017-12-28 HISTORY — DX: Nontoxic goiter, unspecified: E04.9

## 2017-12-28 NOTE — Pre-Procedure Instructions (Signed)
Copy and pasted from care everywhere:  ECG 12-lead1/17/2019 [facility] Component Name Value Ref Range  Vent Rate (bpm) 86   PR Interval (msec) 174   QRS Interval (msec) 78   QT Interval (msec) 368   QTc (msec) 440   Other Result Information  This result has an attachment that is not available.  Result Narrative  Normal sinus rhythm Low voltage QRS Cannot rule out Anterior infarct , age undetermined Abnormal ECG No previous ECGs available I reviewed and concur with this report. Electronically signed [id number] MD, [place] [phone]) on 12/19/2017 5:02:57 PM   Progress Notes - in this encounter  Sheron Nightingale, [place] - 12/15/2017 9:45 AM EST Formatting of this note might be different from the original. Pt returns today to have EKG prior to undergoing hammer toe corrective wsurgery with podiatry. She was recently seen for PE 11/17/2017.   From medical standpoint she is stable to undergo surgical procedure.   EKG shows low voltage but no acute ischemic changes or arrhythmia.  Office Visit on 12/15/2017  Component Date Value Ref Range Status  . Vent Rate (bpm) 12/15/2017 86 Preliminary  . PR Interval (msec) 12/15/2017 174 Preliminary  . QRS Interval (msec) 12/15/2017 78 Preliminary  . QT Interval (msec) 12/15/2017 368 Preliminary  . QTc (msec) 12/15/2017 440 Preliminary  Office Visit on 12/14/2017  Component Date Value Ref Range Status  . Sedimentation Rate-Automated 12/14/2017 10 0 - 30 mm/hr Final   Paulita Cradle, PA-C    Electronically Signed by Sheron Nightingale, PA on 12/15/2017 9:45 AM EST

## 2017-12-28 NOTE — Patient Instructions (Signed)
Your procedure is scheduled on: Friday, January 06, 2018 Report to Same Day Surgery on the 2nd floor in the [facility]. To find out your arrival time, please call [phone] between 1PM - 3PM on: Thursday, January 05, 2018  REMEMBER: Instructions that are not followed completely may result in serious medical risk, up to and including death; or upon the discretion of your surgeon and anesthesiologist your surgery may need to be rescheduled.  Do not eat food after midnight the night before your procedure.  No gum chewing or hard candies.  You may however, drink water up to 2 hours before you are scheduled to arrive at the hospital for your procedure.  Do not drink water within 2 hours of the start of your surgery.  No Alcohol for 24 hours before or after surgery.  No Smoking including e-cigarettes for 24 hours prior to surgery. No chewable tobacco products for at least 6 hours prior to surgery. No nicotine patches on the day of surgery.  On the morning of surgery brush your teeth with toothpaste and water, you may rinse your mouth with mouthwash if you wish. Do not swallow any  toothpaste of mouthwash.  Notify your doctor if there is any change in your medical condition (cold, fever, infection).  Do not wear jewelry, make-up, hairpins, clips or nail polish.  Do not wear lotions, powders, or perfumes. You may wear deodorant.  Do not shave 48 hours prior to surgery.   Contacts and dentures may not be worn into surgery.  Do not bring valuables to the hospital. [facility] is not responsible for any belongings or valuables.  TAKE THESE MEDICATIONS THE MORNING OF SURGERY WITH A SIP OF WATER:  1.  GABAPENTIN 2.  OMEPRAZOLE (take one capsule the night before surgery and one capsule the morning of surgery) - helps to prevent nausea after surgery  Use CHG Soap as directed on instruction sheet.  Stop Metformin 2 days prior to surgery. TAKE LAST DOSE ON February 5. (may resume after  surgery)  On February 1:  Stop ASPIRIN and Anti-inflammatories such as Advil, Aleve, Ibuprofen, Motrin, Naproxen, Naprosyn, Goodie powder, or aspirin products. (May take Tylenol or Acetaminophen if needed.)  On February 1:  Stop ANY OVER THE COUNTER supplements until after surgery. (May continue Vitamin D, Vitamin B, and multivitamin.)  If you are being discharged the day of surgery, you will not be allowed to drive home. You will need someone to drive you home and stay with you that night.   If you are taking public transportation, you will need to have a responsible adult to with you.  Please call the number above if you have any questions about these instructions.

## 2018-01-05 MED ORDER — CLINDAMYCIN PHOSPHATE 900 MG/50ML IV SOLN: 900.0000 mg | INTRAVENOUS | Status: DC

## 2018-01-06 ENCOUNTER — Ambulatory Visit
Admission: RE | Admit: 2018-01-06 | Discharge: 2018-01-06 | Disposition: A | Discharge status: Home / Self Care | Payer: PPO | Source: Ambulatory Visit | Attending: Podiatry | Admitting: Podiatry

## 2018-01-06 ENCOUNTER — Ambulatory Visit: Payer: PPO | Admitting: Anesthesiology

## 2018-01-06 ENCOUNTER — Encounter
Admission: RE | Disposition: A | Discharge status: Home / Self Care | Payer: Self-pay | Source: Ambulatory Visit | Attending: Podiatry

## 2018-01-06 DIAGNOSIS — Z96653 Presence of artificial knee joint, bilateral: Secondary | ICD-10-CM | POA: Insufficient documentation

## 2018-01-06 DIAGNOSIS — Z885 Allergy status to narcotic agent status: Secondary | ICD-10-CM | POA: Diagnosis not present

## 2018-01-06 DIAGNOSIS — Z7982 Long term (current) use of aspirin: Secondary | ICD-10-CM | POA: Diagnosis not present

## 2018-01-06 DIAGNOSIS — K219 Gastro-esophageal reflux disease without esophagitis: Secondary | ICD-10-CM | POA: Diagnosis not present

## 2018-01-06 DIAGNOSIS — E1142 Type 2 diabetes mellitus with diabetic polyneuropathy: Secondary | ICD-10-CM | POA: Insufficient documentation

## 2018-01-06 DIAGNOSIS — M2042 Other hammer toe(s) (acquired), left foot: Secondary | ICD-10-CM | POA: Diagnosis not present

## 2018-01-06 DIAGNOSIS — I10 Essential (primary) hypertension: Secondary | ICD-10-CM | POA: Insufficient documentation

## 2018-01-06 DIAGNOSIS — Z888 Allergy status to other drugs, medicaments and biological substances status: Secondary | ICD-10-CM | POA: Insufficient documentation

## 2018-01-06 DIAGNOSIS — M216X2 Other acquired deformities of left foot: Secondary | ICD-10-CM | POA: Diagnosis not present

## 2018-01-06 DIAGNOSIS — Z7984 Long term (current) use of oral hypoglycemic drugs: Secondary | ICD-10-CM | POA: Diagnosis not present

## 2018-01-06 DIAGNOSIS — M205X2 Other deformities of toe(s) (acquired), left foot: Secondary | ICD-10-CM | POA: Diagnosis not present

## 2018-01-06 DIAGNOSIS — E538 Deficiency of other specified B group vitamins: Secondary | ICD-10-CM | POA: Insufficient documentation

## 2018-01-06 DIAGNOSIS — K589 Irritable bowel syndrome without diarrhea: Secondary | ICD-10-CM | POA: Insufficient documentation

## 2018-01-06 DIAGNOSIS — Z79899 Other long term (current) drug therapy: Secondary | ICD-10-CM | POA: Insufficient documentation

## 2018-01-06 DIAGNOSIS — I081 Rheumatic disorders of both mitral and tricuspid valves: Secondary | ICD-10-CM | POA: Insufficient documentation

## 2018-01-06 DIAGNOSIS — M1990 Unspecified osteoarthritis, unspecified site: Secondary | ICD-10-CM | POA: Diagnosis not present

## 2018-01-06 DIAGNOSIS — E1140 Type 2 diabetes mellitus with diabetic neuropathy, unspecified: Secondary | ICD-10-CM | POA: Diagnosis not present

## 2018-01-06 DIAGNOSIS — M79672 Pain in left foot: Secondary | ICD-10-CM | POA: Diagnosis not present

## 2018-01-06 DIAGNOSIS — D649 Anemia, unspecified: Secondary | ICD-10-CM | POA: Diagnosis not present

## 2018-01-06 DIAGNOSIS — E7800 Pure hypercholesterolemia, unspecified: Secondary | ICD-10-CM | POA: Diagnosis not present

## 2018-01-06 HISTORY — PX: WEIL OSTEOTOMY: SHX5044

## 2018-01-06 HISTORY — PX: HAMMER TOE SURGERY: SHX385

## 2018-01-06 LAB — GLUCOSE, CAPILLARY
GLUCOSE-CAPILLARY: 208 mg/dL — AB (ref 65–99)
GLUCOSE-CAPILLARY: 238 mg/dL — AB (ref 65–99)

## 2018-01-06 SURGERY — OSTEOTOMY, WEIL
Anesthesia: General | Site: Foot | Laterality: Left | Wound class: Clean

## 2018-01-06 MED ORDER — PROPOFOL 10 MG/ML IV BOLUS
INTRAVENOUS | Status: DC | PRN
  Administered 2018-01-06: 20 mg via INTRAVENOUS
  Administered 2018-01-06: 150 mg via INTRAVENOUS
  Administered 2018-01-06: 30 mg via INTRAVENOUS

## 2018-01-06 MED ORDER — GLYCOPYRROLATE 0.2 MG/ML IJ SOLN
INTRAMUSCULAR | Status: DC | PRN
  Administered 2018-01-06: .2 mg via INTRAVENOUS

## 2018-01-06 MED ORDER — FENTANYL CITRATE (PF) 100 MCG/2ML IJ SOLN
INTRAMUSCULAR | Status: AC
  Filled 2018-01-06: qty 2

## 2018-01-06 MED ORDER — ONDANSETRON HCL 4 MG/2ML IJ SOLN
INTRAMUSCULAR | Status: DC | PRN
  Administered 2018-01-06: 4 mg via INTRAVENOUS

## 2018-01-06 MED ORDER — EPHEDRINE SULFATE 50 MG/ML IJ SOLN
INTRAMUSCULAR | Status: DC | PRN
  Administered 2018-01-06: 50 mg via INTRAVENOUS

## 2018-01-06 MED ORDER — HYDROCODONE-ACETAMINOPHEN 5-325 MG PO TABS: 1.0000 | ORAL_TABLET | Freq: Four times a day (QID) | ORAL | 0 refills | Status: DC | PRN

## 2018-01-06 MED ORDER — SEVOFLURANE IN SOLN
RESPIRATORY_TRACT | Status: AC
  Filled 2018-01-06: qty 250

## 2018-01-06 MED ORDER — BUPIVACAINE HCL (PF) 0.5 % IJ SOLN
INTRAMUSCULAR | Status: AC
  Filled 2018-01-06: qty 30

## 2018-01-06 MED ORDER — ONDANSETRON HCL 4 MG/2ML IJ SOLN: 4.0000 mg | Freq: Once | INTRAMUSCULAR | Status: DC | PRN

## 2018-01-06 MED ORDER — PROPOFOL 10 MG/ML IV BOLUS
INTRAVENOUS | Status: AC
  Filled 2018-01-06: qty 20

## 2018-01-06 MED ORDER — SODIUM CHLORIDE 0.9 % IV SOLN
INTRAVENOUS | Status: DC | PRN
  Administered 2018-01-06: 60 mL

## 2018-01-06 MED ORDER — BUPIVACAINE HCL 0.5 % IJ SOLN
INTRAMUSCULAR | Status: DC | PRN
  Administered 2018-01-06: 9 mL

## 2018-01-06 MED ORDER — POVIDONE-IODINE 7.5 % EX SOLN: Freq: Once | CUTANEOUS | Status: DC

## 2018-01-06 MED ORDER — FENTANYL CITRATE (PF) 100 MCG/2ML IJ SOLN: 25.0000 ug | INTRAMUSCULAR | Status: DC | PRN

## 2018-01-06 MED ORDER — CLINDAMYCIN PHOSPHATE 900 MG/50ML IV SOLN
INTRAVENOUS | Status: AC
  Filled 2018-01-06: qty 50

## 2018-01-06 MED ORDER — LACTATED RINGERS IV SOLN
INTRAVENOUS | Status: DC | PRN
  Administered 2018-01-06: 07:00:00 via INTRAVENOUS

## 2018-01-06 MED ORDER — MIDAZOLAM HCL 2 MG/2ML IJ SOLN
INTRAMUSCULAR | Status: AC
  Filled 2018-01-06: qty 2

## 2018-01-06 MED ORDER — DEXAMETHASONE SODIUM PHOSPHATE 10 MG/ML IJ SOLN
INTRAMUSCULAR | Status: AC
  Filled 2018-01-06: qty 1

## 2018-01-06 MED ORDER — LIDOCAINE HCL (PF) 1 % IJ SOLN
INTRAMUSCULAR | Status: AC
  Filled 2018-01-06: qty 30

## 2018-01-06 MED ORDER — FENTANYL CITRATE (PF) 100 MCG/2ML IJ SOLN
INTRAMUSCULAR | Status: DC | PRN
  Administered 2018-01-06 (×2): 50 ug via INTRAVENOUS

## 2018-01-06 MED ORDER — LIDOCAINE HCL (PF) 2 % IJ SOLN
INTRAMUSCULAR | Status: AC
  Filled 2018-01-06: qty 10

## 2018-01-06 MED ORDER — SODIUM CHLORIDE 0.9 % IV SOLN
INTRAVENOUS | Status: DC
  Administered 2018-01-06: 1000 mL via INTRAVENOUS

## 2018-01-06 MED ORDER — BUPIVACAINE LIPOSOME 1.3 % IJ SUSP
INTRAMUSCULAR | Status: AC
  Filled 2018-01-06: qty 20

## 2018-01-06 MED ORDER — PHENYLEPHRINE HCL 10 MG/ML IJ SOLN
INTRAMUSCULAR | Status: DC | PRN
  Administered 2018-01-06: 100 ug via INTRAVENOUS

## 2018-01-06 MED ORDER — SODIUM CHLORIDE 0.9 % IJ SOLN
INTRAMUSCULAR | Status: AC
  Filled 2018-01-06: qty 50

## 2018-01-06 MED ORDER — MIDAZOLAM HCL 2 MG/2ML IJ SOLN
INTRAMUSCULAR | Status: DC | PRN
  Administered 2018-01-06: 1 mg via INTRAVENOUS

## 2018-01-06 MED ORDER — GLYCOPYRROLATE 0.2 MG/ML IJ SOLN
INTRAMUSCULAR | Status: AC
  Filled 2018-01-06: qty 1

## 2018-01-06 MED ORDER — LIDOCAINE HCL (CARDIAC) 20 MG/ML IV SOLN
INTRAVENOUS | Status: DC | PRN
  Administered 2018-01-06: 40 mg via INTRAVENOUS

## 2018-01-06 SURGICAL SUPPLY — 52 items
BANDAGE ELASTIC 4 LF NS (GAUZE/BANDAGES/DRESSINGS) ×6 IMPLANT
BANDAGE STRETCH 3X4.1 STRL (GAUZE/BANDAGES/DRESSINGS) IMPLANT
BIT DRILL 1.7 LNG CANN (DRILL) ×3 IMPLANT
BLADE MED AGGRESSIVE (BLADE) ×3 IMPLANT
BLADE OSC/SAGITTAL 5.5X25 (BLADE) IMPLANT
BLADE OSC/SAGITTAL MD 5.5X18 (BLADE) ×3 IMPLANT
BLADE SURG 15 STRL LF DISP TIS (BLADE) ×2 IMPLANT
BLADE SURG 15 STRL SS (BLADE) ×4
BLADE SURG MINI STRL (BLADE) ×3 IMPLANT
BNDG ESMARK 4X12 TAN STRL LF (GAUZE/BANDAGES/DRESSINGS) ×3 IMPLANT
BNDG GAUZE 4.5X4.1 6PLY STRL (MISCELLANEOUS) IMPLANT
C-wire .045 ×6 IMPLANT
CANISTER SUCT 1200ML W/VALVE (MISCELLANEOUS) ×3 IMPLANT
CLOSURE WOUND 1/4X4 (GAUZE/BANDAGES/DRESSINGS) ×1
CNTRSNK DRL 2 HDLS SCR (MISCELLANEOUS) ×1 IMPLANT
COUNTERSINK 2.0 (MISCELLANEOUS) ×2
COVER PIN YLW 0.028-062 (MISCELLANEOUS) ×6 IMPLANT
CUFF TOURN 18 STER (MISCELLANEOUS) ×3 IMPLANT
CUFF TOURN DUAL PL 12 NO SLV (MISCELLANEOUS) ×3 IMPLANT
DRAPE FLUOR MINI C-ARM 54X84 (DRAPES) ×3 IMPLANT
DURAPREP 26ML APPLICATOR (WOUND CARE) ×3 IMPLANT
ELECT REM PT RETURN 9FT ADLT (ELECTROSURGICAL) ×3
ELECTRODE REM PT RTRN 9FT ADLT (ELECTROSURGICAL) ×1 IMPLANT
GAUZE PETRO XEROFOAM 1X8 (MISCELLANEOUS) ×3 IMPLANT
GAUZE SPONGE 4X4 12PLY STRL (GAUZE/BANDAGES/DRESSINGS) ×3 IMPLANT
GLOVE BIO SURGEON STRL SZ8 (GLOVE) ×3 IMPLANT
GLOVE INDICATOR 8.0 STRL GRN (GLOVE) ×3 IMPLANT
GOWN STRL REUS W/ TWL LRG LVL3 (GOWN DISPOSABLE) ×2 IMPLANT
GOWN STRL REUS W/TWL LRG LVL3 (GOWN DISPOSABLE) ×4
KIT TURNOVER KIT A (KITS) ×3 IMPLANT
LABEL OR SOLS (LABEL) ×3 IMPLANT
NDL SAFETY ECLIPSE 18X1.5 (NEEDLE) ×1 IMPLANT
NEEDLE FILTER BLUNT 18X 1/2SAF (NEEDLE) ×2
NEEDLE FILTER BLUNT 18X1 1/2 (NEEDLE) ×1 IMPLANT
NEEDLE HYPO 18GX1.5 SHARP (NEEDLE) ×2
NEEDLE HYPO 25X1 1.5 SAFETY (NEEDLE) ×9 IMPLANT
NS IRRIG 500ML POUR BTL (IV SOLUTION) ×3 IMPLANT
PACK EXTREMITY ARMC (MISCELLANEOUS) ×3 IMPLANT
PAD PREP 24X41 OB/GYN DISP (PERSONAL CARE ITEMS) ×3 IMPLANT
PENCIL ELECTRO HAND CTR (MISCELLANEOUS) ×3 IMPLANT
RASP SM TEAR CROSS CUT (RASP) IMPLANT
SCREW HEADLESS SHRT THRD 2X12 (Screw) ×6 IMPLANT
SPLINT FAST PLASTER 5X30 (CAST SUPPLIES) ×2
SPLINT PLASTER CAST FAST 5X30 (CAST SUPPLIES) ×1 IMPLANT
STOCKINETTE STRL 6IN 960660 (GAUZE/BANDAGES/DRESSINGS) ×3 IMPLANT
STRIP CLOSURE SKIN 1/4X4 (GAUZE/BANDAGES/DRESSINGS) ×2 IMPLANT
SUT ETHILON 5-0 FS-2 18 BLK (SUTURE) ×3 IMPLANT
SUT VIC AB 4-0 FS2 27 (SUTURE) ×3 IMPLANT
SUT VICRYL AB 3-0 FS1 BRD 27IN (SUTURE) ×3 IMPLANT
SYR 10ML LL (SYRINGE) ×6 IMPLANT
WIRE Z .045 C-WIRE SPADE TIP (WIRE) ×4 IMPLANT
WIRE Z .062 C-WIRE SPADE TIP (WIRE) IMPLANT

## 2018-01-06 NOTE — Anesthesia Post-op Follow-up Note (Signed)
Anesthesia QCDR form completed.        

## 2018-01-06 NOTE — Anesthesia Preprocedure Evaluation (Signed)
Anesthesia Evaluation  Patient identified by MRN, date of birth, ID band Patient awake    Reviewed: Allergy & Precautions, NPO status , Patient's Chart, lab work & pertinent test results, reviewed documented beta blocker date and time   History of Anesthesia Complications (+) DIFFICULT AIRWAY  Airway Mallampati: III  TM Distance: >3 FB     Dental  (+) Chipped   Pulmonary           Cardiovascular hypertension,      Neuro/Psych  Neuromuscular disease    GI/Hepatic hiatal hernia, GERD  Controlled,  Endo/Other  diabetes, Type 2  Renal/GU      Musculoskeletal  (+) Arthritis ,   Abdominal   Peds  Hematology  (+) anemia ,   Anesthesia Other Findings   Reproductive/Obstetrics                            Anesthesia Physical Anesthesia Plan  ASA: III  Anesthesia Plan: General   Post-op Pain Management:    Induction: Intravenous  PONV Risk Score and Plan:   Airway Management Planned: LMA  Additional Equipment:   Intra-op Plan:   Post-operative Plan:   Informed Consent: I have reviewed the patients History and Physical, chart, labs and discussed the procedure including the risks, benefits and alternatives for the proposed anesthesia with the patient or authorized representative who has indicated his/her understanding and acceptance.     Plan Discussed with: CRNA  Anesthesia Plan Comments:         Anesthesia Quick Evaluation

## 2018-01-06 NOTE — Anesthesia Procedure Notes (Signed)
Procedure Name: LMA Insertion Date/Time: 01/06/2018 7:35 AM Performed by: Allean Found, CRNA Pre-anesthesia Checklist: Patient identified, Emergency Drugs available, Suction available, Patient being monitored and Timeout performed Patient Re-evaluated:Patient Re-evaluated prior to induction Oxygen Delivery Method: Circle system utilized Preoxygenation: Pre-oxygenation with 100% oxygen Induction Type: IV induction Ventilation: Mask ventilation without difficulty LMA: LMA inserted LMA Size: 4.0 Number of attempts: 1 Placement Confirmation: positive ETCO2 and breath sounds checked- equal and bilateral Secured at: 21 cm Tube secured with: Tape Dental Injury: Teeth and Oropharynx as per pre-operative assessment

## 2018-01-06 NOTE — Discharge Instructions (Signed)
[place] DR. Jeff Davis   1. Take your medication as prescribed.  Pain medication should be taken only as needed.  2. Keep the dressing clean, dry and intact.  3. Keep your foot elevated above the heart level for the first 48 hours.  4. Walking to the bathroom and brief periods of walking are acceptable, unless we have instructed you to be non-weight bearing.  Recommend utilizing a walker for stability and first couple of days utilize some assistance from a family member.  Also be careful when he first get up after laying down for long periods of time sometimes her blood pressure can drop as you get lightheaded.  When you have been lying for a longer period sit up for a couple of minutes and let her blood pressure stabilized  Always wear your post-op shoe when walking.  Use walker and assistance as mentioned above. 5. Do not take a shower. Baths are permissible as long as the foot is kept out of the water.  You can find a shower cast protector at [facility] that she can slip on over the boot and splint if you take a shower.  6. Every hour you are awake:  - Bend your knee 15 times. - Flex foot 15 times - Massage calf 15 times  7. Call [facility] [phone]) if any of the following problems occur: - You develop a temperature or fever. - The bandage becomes saturated with blood. - Medication does not stop your pain. - Injury of the foot occurs. - Any symptoms of infection including redness, odor, or red streaks running from wound.     AMBULATORY SURGERY  DISCHARGE INSTRUCTIONS   1) The drugs that you were given will stay in your system until tomorrow so for the next 24 hours you should not:  A) Drive an automobile B) Make any legal decisions C) Drink any alcoholic beverage   2) You may resume regular meals tomorrow.  Today it is better to start  with liquids and gradually work up to solid foods.  You may eat anything you prefer, but it is better to start with liquids, then soup and crackers, and gradually work up to solid foods.   3) Please notify your doctor immediately if you have any unusual bleeding, trouble breathing, redness and pain at the surgery site, drainage, fever, or pain not relieved by medication.   4) Additional Instructions:    Please contact your physician with any problems or Same Day Surgery at [phone], Monday through Friday 6 am to 4 pm, or [place] [facility] number at [phone].

## 2018-01-06 NOTE — Anesthesia Postprocedure Evaluation (Signed)
Anesthesia Post Note  Patient: MATY ZEISLER  Procedure(s) Performed: WEIL OSTEOTOMY-2ND METATARSAL (Left Foot) HAMMER TOE CORRECTION-2ND & 3RD TOES (Left Foot)  Patient location during evaluation: PACU Anesthesia Type: General Level of consciousness: awake and alert Pain management: pain level controlled Vital Signs Assessment: post-procedure vital signs reviewed and stable Respiratory status: spontaneous breathing, nonlabored ventilation, respiratory function stable and patient connected to nasal cannula oxygen Cardiovascular status: blood pressure returned to baseline and stable Postop Assessment: no apparent nausea or vomiting Anesthetic complications: no     Last Vitals:  Vitals:   01/06/18 0954 01/06/18 1101  BP: (!) 152/62 (!) 154/71  Pulse: 73 75  Resp: 17 17  Temp: 36.4 C 36.4 C  SpO2: 99% 99%    Last Pain:  Vitals:   01/06/18 1101  TempSrc: Temporal  PainSc: 0-No pain                 Lucresha Dismuke S

## 2018-01-06 NOTE — Transfer of Care (Signed)
Immediate Anesthesia Transfer of Care Note  Patient: Rolla Plate  Procedure(s) Performed: WEIL OSTEOTOMY-2ND METATARSAL (Left Foot) HAMMER TOE CORRECTION-2ND & 3RD TOES (Left Foot)  Patient Location: PACU  Anesthesia Type:General  Level of Consciousness: awake  Airway & Oxygen Therapy: Patient Spontanous Breathing and Patient connected to face mask oxygen  Post-op Assessment: Report given to RN and Post -op Vital signs reviewed and stable  Post vital signs: Reviewed and stable  Last Vitals:  Vitals:   01/06/18 0615 01/06/18 0917  BP: 134/68   Resp: 16   Temp: (!) 36.3 C (P) 36.5 C  SpO2: 96%     Last Pain:  Vitals:   01/06/18 0615  TempSrc: Tympanic  PainSc: 0-No pain         Complications: No apparent anesthesia complications

## 2018-01-06 NOTE — Anesthesia Procedure Notes (Signed)
Anesthesia Procedure Note     

## 2018-01-06 NOTE — Op Note (Signed)
Operative note   Surgeon: Dr. Albertine Patricia, DPM.    Assistant: None    Preop diagnosis: 1.  Plantar displaced second metatarsal left foot 2.  Hammertoe deformity second toe left foot 3.  Claw toe/hammertoe deformity third toe left foot    Postop diagnosis: Same    Procedure:   1.  Osteotomy second metatarsal with 2 screw fixation using Paragon 2.0 mm headless screws   2.  Hammertoe repair with PIP joint fusion and K wire fixation second toe left foot 3.  Hammertoe/claw toe repair third toe left foot with middle phalanx head resection and K wire fixation     EBL: Less than 5 cc    Anesthesia:general delivered by the anesthesia team.  I delivered 9 cc 0.5% Marcaine plain at the operative site before surgery.  I injected 10 cc of Exparel at the operative site postoperatively this was mixed with 20 cc of Exparel with 40 cc of saline    Hemostasis: Ankle tourniquet 225 mils mercury pressure for 67 minutes    Specimen: None    Complications: None    Operative indications: Chronic pain structural deformity to the second and third toes on the left foot.  This is been unresponsive to conservative care and has progressively worsened.    Procedure:  Patient was brought into the OR and placed on the operating table in thesupine position. After anesthesia was obtained theleft lower extremity was prepped and draped in usual sterile fashion.  Operative Report: This time the attention was directed to the second MTP joint where a curvilinear incision was made on the dorsum of the left foot.  This was extended into linear incision over the PIP joint of the second toe.  This was deepened with sharp blunt dissection bleeders clamped and bovied as required.  The extensor tendon was identified and the extensor hood release was performed medially and the tendon retracted laterally.  At this point periosteum and capsule tissue over the second MTP joint distal metatarsal was identified and incised  longitudinally and reflected medial laterally.  At this point osteotomies performed the second metatarsal at the osteochondral junction from dorsal distal plantar proximal.  The head of the metatarsal was then transposed more medial position fixated temporarily with 2 K wires checked FluoroScan good position correction were noted.  At this point to 2.0 screws from the many monster Paragon set were utilized to fixate the area.  These were headless screws.  There is then checked FluoroScan good position and correction and fixation were noted at this timeframe.  There is an copiously irrigated.    Procedure #2 at this time attention directed to the second toe where the skin incision are been accomplished.  The extension was identified and incised transversely reflected proximally and distally off the proximal phalanx had middle phalanx base.  Cartilage was resected in these regions.  Fenestration was achieved in the 2 components of resected osteochondral bone at this juncture.  Good fresh bone was noted at both sites.  The K wire was then run through the middle distal phalanges and retrograded in the proximal phalanx while holding the toe in a rectus position.  Made sure there is no soft tissue interposition at the area in the K wire was then run across the MTP joint at the shaft of the second metatarsal.  There is checked FluoroScan good position correction were noted.  Second metatarsal osteotomy maintained its corrected position.  After copious irrigation along with the entire incisIon margin the  extensor tendon at the toe was then closed with 3-0 Vicryl simple interrupted sutures.  Proximally the capsular and periosteal tissue was closed over the metatarsal with 4-0 Vicryl in continuous stitch.  Deep superficial fascial layers were closed along the incision margin with 4-0 Vicryl in continuous stitch.  Skin was closed with 4-0 Vicryl in subcuticular stitch.  Procedure #3: Next attention was directed to the  third toe of the left foot at the DIP joint level.  2 semielliptical incisions were made transversely across the DIP joint.  This lip skin was then removed.  Extensor tendon was identified and incised transversely reflected proximally.  The head of the middle phalanx was then resected with power equipment.  At this point a K wire was drilled through the distal phalanx and retrograded in the middle and proximal phalanx shaft.  There is checked FluoroScan good position and correction were noted at this timeframe.  There was an copiously irrigated and the extensor tendon was reapproximated with 3-0 Vicryl simple interrupted sutures.  A 5-0 nylon was used across the skin and tendon in order to serve as an anchor stitch.  The remaining skin was closed with 5-0 nylon horizontal mattress sutures.  At this time the areas were blocked with the Exparel as noted.  A sterile compressive dressing was placed across wound consisting of Steri-Strips Xeroform gauze 4 x 4's Kling Kerlix.  Tourniquet was released prior to complete vascular sling return to all digits of the left foot.  A posterior splint was placed on the left foot leg in the operating room.    Patient tolerated the procedure and anesthesia well.  Was transported from the OR to the PACU with all vital signs stable and vascular status intact. To be discharged per routine protocol.  Will follow up in approximately 1 week in the outpatient clinic.

## 2018-01-06 NOTE — H&P (Signed)
H and P has been reviewed and no changes are noted.  

## 2018-01-09 ENCOUNTER — Encounter: Payer: Self-pay | Admitting: Podiatry

## 2018-01-11 DIAGNOSIS — M2041 Other hammer toe(s) (acquired), right foot: Secondary | ICD-10-CM | POA: Diagnosis not present

## 2018-01-11 DIAGNOSIS — M216X2 Other acquired deformities of left foot: Secondary | ICD-10-CM | POA: Diagnosis not present

## 2018-01-11 DIAGNOSIS — M2042 Other hammer toe(s) (acquired), left foot: Secondary | ICD-10-CM | POA: Diagnosis not present

## 2018-01-17 DIAGNOSIS — G8929 Other chronic pain: Secondary | ICD-10-CM | POA: Diagnosis not present

## 2018-01-17 DIAGNOSIS — Z96651 Presence of right artificial knee joint: Secondary | ICD-10-CM | POA: Diagnosis not present

## 2018-01-17 DIAGNOSIS — Z96652 Presence of left artificial knee joint: Secondary | ICD-10-CM | POA: Diagnosis not present

## 2018-01-17 DIAGNOSIS — M25562 Pain in left knee: Secondary | ICD-10-CM | POA: Diagnosis not present

## 2018-01-17 DIAGNOSIS — M25561 Pain in right knee: Secondary | ICD-10-CM | POA: Diagnosis not present

## 2018-01-18 DIAGNOSIS — M216X2 Other acquired deformities of left foot: Secondary | ICD-10-CM | POA: Diagnosis not present

## 2018-01-24 DIAGNOSIS — D510 Vitamin B12 deficiency anemia due to intrinsic factor deficiency: Secondary | ICD-10-CM | POA: Diagnosis not present

## 2018-02-13 DIAGNOSIS — M216X2 Other acquired deformities of left foot: Secondary | ICD-10-CM | POA: Diagnosis not present

## 2018-02-13 DIAGNOSIS — M2042 Other hammer toe(s) (acquired), left foot: Secondary | ICD-10-CM | POA: Diagnosis not present

## 2018-02-15 DIAGNOSIS — E119 Type 2 diabetes mellitus without complications: Secondary | ICD-10-CM | POA: Diagnosis not present

## 2018-02-15 DIAGNOSIS — E782 Mixed hyperlipidemia: Secondary | ICD-10-CM | POA: Diagnosis not present

## 2018-02-15 DIAGNOSIS — I10 Essential (primary) hypertension: Secondary | ICD-10-CM | POA: Diagnosis not present

## 2018-02-24 DIAGNOSIS — R197 Diarrhea, unspecified: Secondary | ICD-10-CM | POA: Diagnosis not present

## 2018-02-24 DIAGNOSIS — E782 Mixed hyperlipidemia: Secondary | ICD-10-CM | POA: Diagnosis not present

## 2018-02-24 DIAGNOSIS — E118 Type 2 diabetes mellitus with unspecified complications: Secondary | ICD-10-CM | POA: Diagnosis not present

## 2018-02-24 DIAGNOSIS — I10 Essential (primary) hypertension: Secondary | ICD-10-CM | POA: Diagnosis not present

## 2018-02-24 DIAGNOSIS — Z0000 Encounter for general adult medical examination without abnormal findings: Secondary | ICD-10-CM | POA: Diagnosis not present

## 2018-02-24 DIAGNOSIS — M1990 Unspecified osteoarthritis, unspecified site: Secondary | ICD-10-CM | POA: Diagnosis not present

## 2018-03-07 DIAGNOSIS — M7752 Other enthesopathy of left foot: Secondary | ICD-10-CM | POA: Diagnosis not present

## 2018-03-21 DIAGNOSIS — H353212 Exudative age-related macular degeneration, right eye, with inactive choroidal neovascularization: Secondary | ICD-10-CM | POA: Diagnosis not present

## 2018-03-28 DIAGNOSIS — D510 Vitamin B12 deficiency anemia due to intrinsic factor deficiency: Secondary | ICD-10-CM | POA: Diagnosis not present

## 2018-04-28 DIAGNOSIS — D510 Vitamin B12 deficiency anemia due to intrinsic factor deficiency: Secondary | ICD-10-CM | POA: Diagnosis not present

## 2018-05-30 DIAGNOSIS — D510 Vitamin B12 deficiency anemia due to intrinsic factor deficiency: Secondary | ICD-10-CM | POA: Diagnosis not present

## 2018-06-07 DIAGNOSIS — E113393 Type 2 diabetes mellitus with moderate nonproliferative diabetic retinopathy without macular edema, bilateral: Secondary | ICD-10-CM | POA: Diagnosis not present

## 2018-06-08 DIAGNOSIS — G5701 Lesion of sciatic nerve, right lower limb: Secondary | ICD-10-CM | POA: Diagnosis not present

## 2018-06-08 DIAGNOSIS — M25551 Pain in right hip: Secondary | ICD-10-CM | POA: Diagnosis not present

## 2018-06-20 DIAGNOSIS — H35373 Puckering of macula, bilateral: Secondary | ICD-10-CM | POA: Diagnosis not present

## 2018-06-20 DIAGNOSIS — E782 Mixed hyperlipidemia: Secondary | ICD-10-CM | POA: Diagnosis not present

## 2018-06-20 DIAGNOSIS — H353122 Nonexudative age-related macular degeneration, left eye, intermediate dry stage: Secondary | ICD-10-CM | POA: Diagnosis not present

## 2018-06-20 DIAGNOSIS — E118 Type 2 diabetes mellitus with unspecified complications: Secondary | ICD-10-CM | POA: Diagnosis not present

## 2018-06-20 DIAGNOSIS — H353212 Exudative age-related macular degeneration, right eye, with inactive choroidal neovascularization: Secondary | ICD-10-CM | POA: Diagnosis not present

## 2018-06-20 DIAGNOSIS — H43813 Vitreous degeneration, bilateral: Secondary | ICD-10-CM | POA: Diagnosis not present

## 2018-06-20 DIAGNOSIS — I10 Essential (primary) hypertension: Secondary | ICD-10-CM | POA: Diagnosis not present

## 2018-06-21 DIAGNOSIS — E118 Type 2 diabetes mellitus with unspecified complications: Secondary | ICD-10-CM | POA: Diagnosis not present

## 2018-06-21 DIAGNOSIS — I10 Essential (primary) hypertension: Secondary | ICD-10-CM | POA: Diagnosis not present

## 2018-06-21 DIAGNOSIS — E782 Mixed hyperlipidemia: Secondary | ICD-10-CM | POA: Diagnosis not present

## 2018-06-30 DIAGNOSIS — D510 Vitamin B12 deficiency anemia due to intrinsic factor deficiency: Secondary | ICD-10-CM | POA: Diagnosis not present

## 2018-07-12 DIAGNOSIS — J069 Acute upper respiratory infection, unspecified: Secondary | ICD-10-CM | POA: Diagnosis not present

## 2018-07-12 DIAGNOSIS — H6123 Impacted cerumen, bilateral: Secondary | ICD-10-CM | POA: Diagnosis not present

## 2018-07-12 DIAGNOSIS — J301 Allergic rhinitis due to pollen: Secondary | ICD-10-CM | POA: Diagnosis not present

## 2018-07-17 DIAGNOSIS — L820 Inflamed seborrheic keratosis: Secondary | ICD-10-CM | POA: Diagnosis not present

## 2018-07-17 DIAGNOSIS — L578 Other skin changes due to chronic exposure to nonionizing radiation: Secondary | ICD-10-CM | POA: Diagnosis not present

## 2018-07-17 DIAGNOSIS — L821 Other seborrheic keratosis: Secondary | ICD-10-CM | POA: Diagnosis not present

## 2018-07-18 DIAGNOSIS — E1165 Type 2 diabetes mellitus with hyperglycemia: Secondary | ICD-10-CM | POA: Diagnosis not present

## 2018-07-18 DIAGNOSIS — E785 Hyperlipidemia, unspecified: Secondary | ICD-10-CM | POA: Diagnosis not present

## 2018-07-18 DIAGNOSIS — E1342 Other specified diabetes mellitus with diabetic polyneuropathy: Secondary | ICD-10-CM | POA: Diagnosis not present

## 2018-07-18 DIAGNOSIS — E1159 Type 2 diabetes mellitus with other circulatory complications: Secondary | ICD-10-CM | POA: Diagnosis not present

## 2018-07-18 DIAGNOSIS — I10 Essential (primary) hypertension: Secondary | ICD-10-CM | POA: Diagnosis not present

## 2018-07-18 DIAGNOSIS — E1169 Type 2 diabetes mellitus with other specified complication: Secondary | ICD-10-CM | POA: Diagnosis not present

## 2018-08-02 DIAGNOSIS — D510 Vitamin B12 deficiency anemia due to intrinsic factor deficiency: Secondary | ICD-10-CM | POA: Diagnosis not present

## 2018-08-09 DIAGNOSIS — L821 Other seborrheic keratosis: Secondary | ICD-10-CM | POA: Diagnosis not present

## 2018-08-09 DIAGNOSIS — L820 Inflamed seborrheic keratosis: Secondary | ICD-10-CM | POA: Diagnosis not present

## 2018-08-15 DIAGNOSIS — E1142 Type 2 diabetes mellitus with diabetic polyneuropathy: Secondary | ICD-10-CM | POA: Diagnosis not present

## 2018-08-15 DIAGNOSIS — B351 Tinea unguium: Secondary | ICD-10-CM | POA: Diagnosis not present

## 2018-08-15 DIAGNOSIS — L851 Acquired keratosis [keratoderma] palmaris et plantaris: Secondary | ICD-10-CM | POA: Diagnosis not present

## 2018-09-05 DIAGNOSIS — D510 Vitamin B12 deficiency anemia due to intrinsic factor deficiency: Secondary | ICD-10-CM | POA: Diagnosis not present

## 2018-09-05 DIAGNOSIS — Z23 Encounter for immunization: Secondary | ICD-10-CM | POA: Diagnosis not present

## 2018-09-14 ENCOUNTER — Other Ambulatory Visit: Payer: Self-pay | Admitting: Physician Assistant

## 2018-09-14 DIAGNOSIS — R42 Dizziness and giddiness: Secondary | ICD-10-CM | POA: Diagnosis not present

## 2018-09-14 DIAGNOSIS — G459 Transient cerebral ischemic attack, unspecified: Secondary | ICD-10-CM

## 2018-09-14 DIAGNOSIS — R4701 Aphasia: Secondary | ICD-10-CM | POA: Diagnosis not present

## 2018-09-14 DIAGNOSIS — E118 Type 2 diabetes mellitus with unspecified complications: Secondary | ICD-10-CM | POA: Diagnosis not present

## 2018-09-14 DIAGNOSIS — Z8673 Personal history of transient ischemic attack (TIA), and cerebral infarction without residual deficits: Secondary | ICD-10-CM | POA: Diagnosis not present

## 2018-09-14 DIAGNOSIS — R51 Headache: Secondary | ICD-10-CM | POA: Diagnosis not present

## 2018-09-15 ENCOUNTER — Ambulatory Visit
Admission: RE | Admit: 2018-09-15 | Discharge: 2018-09-15 | Disposition: A | Discharge status: Home / Self Care | Payer: PPO | Source: Ambulatory Visit | Attending: Physician Assistant | Admitting: Physician Assistant

## 2018-09-15 DIAGNOSIS — R51 Headache: Secondary | ICD-10-CM | POA: Insufficient documentation

## 2018-09-15 DIAGNOSIS — I771 Stricture of artery: Secondary | ICD-10-CM | POA: Diagnosis not present

## 2018-09-15 DIAGNOSIS — I672 Cerebral atherosclerosis: Secondary | ICD-10-CM | POA: Diagnosis not present

## 2018-09-15 DIAGNOSIS — R42 Dizziness and giddiness: Secondary | ICD-10-CM | POA: Diagnosis not present

## 2018-09-15 DIAGNOSIS — R410 Disorientation, unspecified: Secondary | ICD-10-CM | POA: Insufficient documentation

## 2018-09-15 DIAGNOSIS — E118 Type 2 diabetes mellitus with unspecified complications: Secondary | ICD-10-CM | POA: Diagnosis not present

## 2018-09-15 DIAGNOSIS — R4701 Aphasia: Secondary | ICD-10-CM | POA: Diagnosis not present

## 2018-09-15 DIAGNOSIS — Z8673 Personal history of transient ischemic attack (TIA), and cerebral infarction without residual deficits: Secondary | ICD-10-CM | POA: Diagnosis not present

## 2018-09-15 DIAGNOSIS — I6523 Occlusion and stenosis of bilateral carotid arteries: Secondary | ICD-10-CM | POA: Diagnosis not present

## 2018-09-15 DIAGNOSIS — G459 Transient cerebral ischemic attack, unspecified: Secondary | ICD-10-CM | POA: Diagnosis not present

## 2018-09-19 DIAGNOSIS — H353212 Exudative age-related macular degeneration, right eye, with inactive choroidal neovascularization: Secondary | ICD-10-CM | POA: Diagnosis not present

## 2018-09-19 DIAGNOSIS — H353122 Nonexudative age-related macular degeneration, left eye, intermediate dry stage: Secondary | ICD-10-CM | POA: Diagnosis not present

## 2018-09-21 ENCOUNTER — Other Ambulatory Visit: Payer: Self-pay | Admitting: Physician Assistant

## 2018-09-21 DIAGNOSIS — G459 Transient cerebral ischemic attack, unspecified: Secondary | ICD-10-CM

## 2018-09-27 DIAGNOSIS — G459 Transient cerebral ischemic attack, unspecified: Secondary | ICD-10-CM | POA: Diagnosis not present

## 2018-10-10 DIAGNOSIS — D510 Vitamin B12 deficiency anemia due to intrinsic factor deficiency: Secondary | ICD-10-CM | POA: Diagnosis not present

## 2018-10-12 ENCOUNTER — Ambulatory Visit
Admission: RE | Admit: 2018-10-12 | Discharge: 2018-10-12 | Disposition: A | Discharge status: Home / Self Care | Payer: PPO | Source: Ambulatory Visit | Attending: Physician Assistant | Admitting: Physician Assistant

## 2018-10-12 DIAGNOSIS — R4701 Aphasia: Secondary | ICD-10-CM | POA: Insufficient documentation

## 2018-10-12 DIAGNOSIS — I6381 Other cerebral infarction due to occlusion or stenosis of small artery: Secondary | ICD-10-CM | POA: Diagnosis not present

## 2018-10-12 DIAGNOSIS — R410 Disorientation, unspecified: Secondary | ICD-10-CM | POA: Diagnosis not present

## 2018-10-12 DIAGNOSIS — G459 Transient cerebral ischemic attack, unspecified: Secondary | ICD-10-CM

## 2018-10-12 DIAGNOSIS — R51 Headache: Secondary | ICD-10-CM | POA: Insufficient documentation

## 2018-10-12 DIAGNOSIS — Z8673 Personal history of transient ischemic attack (TIA), and cerebral infarction without residual deficits: Secondary | ICD-10-CM | POA: Insufficient documentation

## 2018-10-12 DIAGNOSIS — R42 Dizziness and giddiness: Secondary | ICD-10-CM | POA: Diagnosis not present

## 2018-10-18 ENCOUNTER — Other Ambulatory Visit: Payer: Self-pay | Admitting: Physician Assistant

## 2018-10-18 DIAGNOSIS — L821 Other seborrheic keratosis: Secondary | ICD-10-CM | POA: Diagnosis not present

## 2018-10-18 DIAGNOSIS — Z1231 Encounter for screening mammogram for malignant neoplasm of breast: Secondary | ICD-10-CM

## 2018-10-18 DIAGNOSIS — E118 Type 2 diabetes mellitus with unspecified complications: Secondary | ICD-10-CM | POA: Diagnosis not present

## 2018-10-18 DIAGNOSIS — L820 Inflamed seborrheic keratosis: Secondary | ICD-10-CM | POA: Diagnosis not present

## 2018-10-18 DIAGNOSIS — E782 Mixed hyperlipidemia: Secondary | ICD-10-CM | POA: Diagnosis not present

## 2018-10-18 DIAGNOSIS — I10 Essential (primary) hypertension: Secondary | ICD-10-CM | POA: Diagnosis not present

## 2018-10-19 DIAGNOSIS — I10 Essential (primary) hypertension: Secondary | ICD-10-CM | POA: Diagnosis not present

## 2018-10-19 DIAGNOSIS — E118 Type 2 diabetes mellitus with unspecified complications: Secondary | ICD-10-CM | POA: Diagnosis not present

## 2018-10-19 DIAGNOSIS — E782 Mixed hyperlipidemia: Secondary | ICD-10-CM | POA: Diagnosis not present

## 2018-10-25 DIAGNOSIS — E118 Type 2 diabetes mellitus with unspecified complications: Secondary | ICD-10-CM | POA: Diagnosis not present

## 2018-10-25 DIAGNOSIS — Z8673 Personal history of transient ischemic attack (TIA), and cerebral infarction without residual deficits: Secondary | ICD-10-CM | POA: Diagnosis not present

## 2018-10-25 DIAGNOSIS — E1169 Type 2 diabetes mellitus with other specified complication: Secondary | ICD-10-CM | POA: Diagnosis not present

## 2018-10-25 DIAGNOSIS — Z1239 Encounter for other screening for malignant neoplasm of breast: Secondary | ICD-10-CM | POA: Diagnosis not present

## 2018-10-25 DIAGNOSIS — E1342 Other specified diabetes mellitus with diabetic polyneuropathy: Secondary | ICD-10-CM | POA: Diagnosis not present

## 2018-10-25 DIAGNOSIS — E1159 Type 2 diabetes mellitus with other circulatory complications: Secondary | ICD-10-CM | POA: Diagnosis not present

## 2018-10-25 DIAGNOSIS — Z0000 Encounter for general adult medical examination without abnormal findings: Secondary | ICD-10-CM | POA: Diagnosis not present

## 2018-10-25 DIAGNOSIS — E1165 Type 2 diabetes mellitus with hyperglycemia: Secondary | ICD-10-CM | POA: Diagnosis not present

## 2018-10-25 DIAGNOSIS — E785 Hyperlipidemia, unspecified: Secondary | ICD-10-CM | POA: Diagnosis not present

## 2018-10-25 DIAGNOSIS — I10 Essential (primary) hypertension: Secondary | ICD-10-CM | POA: Diagnosis not present

## 2018-11-15 DIAGNOSIS — L851 Acquired keratosis [keratoderma] palmaris et plantaris: Secondary | ICD-10-CM | POA: Diagnosis not present

## 2018-11-15 DIAGNOSIS — D510 Vitamin B12 deficiency anemia due to intrinsic factor deficiency: Secondary | ICD-10-CM | POA: Diagnosis not present

## 2018-11-15 DIAGNOSIS — E1142 Type 2 diabetes mellitus with diabetic polyneuropathy: Secondary | ICD-10-CM | POA: Diagnosis not present

## 2018-11-15 DIAGNOSIS — B351 Tinea unguium: Secondary | ICD-10-CM | POA: Diagnosis not present

## 2018-11-28 ENCOUNTER — Ambulatory Visit
Admission: RE | Admit: 2018-11-28 | Discharge: 2018-11-28 | Disposition: A | Discharge status: Home / Self Care | Payer: PPO | Source: Ambulatory Visit | Attending: Physician Assistant | Admitting: Physician Assistant

## 2018-11-28 DIAGNOSIS — Z1231 Encounter for screening mammogram for malignant neoplasm of breast: Secondary | ICD-10-CM | POA: Insufficient documentation

## 2018-12-12 DIAGNOSIS — E113393 Type 2 diabetes mellitus with moderate nonproliferative diabetic retinopathy without macular edema, bilateral: Secondary | ICD-10-CM | POA: Diagnosis not present

## 2018-12-19 DIAGNOSIS — D510 Vitamin B12 deficiency anemia due to intrinsic factor deficiency: Secondary | ICD-10-CM | POA: Diagnosis not present

## 2018-12-20 DIAGNOSIS — H35373 Puckering of macula, bilateral: Secondary | ICD-10-CM | POA: Diagnosis not present

## 2018-12-20 DIAGNOSIS — H353122 Nonexudative age-related macular degeneration, left eye, intermediate dry stage: Secondary | ICD-10-CM | POA: Diagnosis not present

## 2018-12-20 DIAGNOSIS — H35423 Microcystoid degeneration of retina, bilateral: Secondary | ICD-10-CM | POA: Diagnosis not present

## 2018-12-20 DIAGNOSIS — H353212 Exudative age-related macular degeneration, right eye, with inactive choroidal neovascularization: Secondary | ICD-10-CM | POA: Diagnosis not present

## 2018-12-21 DIAGNOSIS — E1165 Type 2 diabetes mellitus with hyperglycemia: Secondary | ICD-10-CM | POA: Diagnosis not present

## 2018-12-21 DIAGNOSIS — E1159 Type 2 diabetes mellitus with other circulatory complications: Secondary | ICD-10-CM | POA: Diagnosis not present

## 2018-12-21 DIAGNOSIS — E1342 Other specified diabetes mellitus with diabetic polyneuropathy: Secondary | ICD-10-CM | POA: Diagnosis not present

## 2018-12-21 DIAGNOSIS — E785 Hyperlipidemia, unspecified: Secondary | ICD-10-CM | POA: Diagnosis not present

## 2018-12-21 DIAGNOSIS — I10 Essential (primary) hypertension: Secondary | ICD-10-CM | POA: Diagnosis not present

## 2018-12-21 DIAGNOSIS — E1169 Type 2 diabetes mellitus with other specified complication: Secondary | ICD-10-CM | POA: Diagnosis not present

## 2018-12-21 DIAGNOSIS — E669 Obesity, unspecified: Secondary | ICD-10-CM | POA: Diagnosis not present

## 2019-01-10 DIAGNOSIS — R69 Illness, unspecified: Secondary | ICD-10-CM | POA: Diagnosis not present

## 2019-01-10 DIAGNOSIS — J209 Acute bronchitis, unspecified: Secondary | ICD-10-CM | POA: Diagnosis not present

## 2019-01-23 DIAGNOSIS — Z96653 Presence of artificial knee joint, bilateral: Secondary | ICD-10-CM | POA: Diagnosis not present

## 2019-01-23 DIAGNOSIS — M170 Bilateral primary osteoarthritis of knee: Secondary | ICD-10-CM | POA: Diagnosis not present

## 2019-01-23 DIAGNOSIS — D510 Vitamin B12 deficiency anemia due to intrinsic factor deficiency: Secondary | ICD-10-CM | POA: Diagnosis not present

## 2019-01-29 DIAGNOSIS — L578 Other skin changes due to chronic exposure to nonionizing radiation: Secondary | ICD-10-CM | POA: Diagnosis not present

## 2019-01-29 DIAGNOSIS — L601 Onycholysis: Secondary | ICD-10-CM | POA: Diagnosis not present

## 2019-02-07 DIAGNOSIS — I10 Essential (primary) hypertension: Secondary | ICD-10-CM | POA: Diagnosis not present

## 2019-02-07 DIAGNOSIS — E669 Obesity, unspecified: Secondary | ICD-10-CM | POA: Diagnosis not present

## 2019-02-07 DIAGNOSIS — E1165 Type 2 diabetes mellitus with hyperglycemia: Secondary | ICD-10-CM | POA: Diagnosis not present

## 2019-02-07 DIAGNOSIS — Z794 Long term (current) use of insulin: Secondary | ICD-10-CM | POA: Diagnosis not present

## 2019-02-07 DIAGNOSIS — E1140 Type 2 diabetes mellitus with diabetic neuropathy, unspecified: Secondary | ICD-10-CM | POA: Diagnosis not present

## 2019-02-07 DIAGNOSIS — E1159 Type 2 diabetes mellitus with other circulatory complications: Secondary | ICD-10-CM | POA: Diagnosis not present

## 2019-02-07 DIAGNOSIS — E1169 Type 2 diabetes mellitus with other specified complication: Secondary | ICD-10-CM | POA: Diagnosis not present

## 2019-02-07 DIAGNOSIS — E785 Hyperlipidemia, unspecified: Secondary | ICD-10-CM | POA: Diagnosis not present

## 2019-02-19 DIAGNOSIS — B351 Tinea unguium: Secondary | ICD-10-CM | POA: Diagnosis not present

## 2019-02-19 DIAGNOSIS — E1142 Type 2 diabetes mellitus with diabetic polyneuropathy: Secondary | ICD-10-CM | POA: Diagnosis not present

## 2019-02-19 DIAGNOSIS — L851 Acquired keratosis [keratoderma] palmaris et plantaris: Secondary | ICD-10-CM | POA: Diagnosis not present

## 2019-02-22 DIAGNOSIS — B370 Candidal stomatitis: Secondary | ICD-10-CM | POA: Diagnosis not present

## 2019-02-22 DIAGNOSIS — D510 Vitamin B12 deficiency anemia due to intrinsic factor deficiency: Secondary | ICD-10-CM | POA: Diagnosis not present

## 2019-03-19 DIAGNOSIS — E1169 Type 2 diabetes mellitus with other specified complication: Secondary | ICD-10-CM | POA: Diagnosis not present

## 2019-03-19 DIAGNOSIS — E785 Hyperlipidemia, unspecified: Secondary | ICD-10-CM | POA: Diagnosis not present

## 2019-03-19 DIAGNOSIS — E1165 Type 2 diabetes mellitus with hyperglycemia: Secondary | ICD-10-CM | POA: Diagnosis not present

## 2019-03-19 DIAGNOSIS — E1342 Other specified diabetes mellitus with diabetic polyneuropathy: Secondary | ICD-10-CM | POA: Diagnosis not present

## 2019-03-19 DIAGNOSIS — I10 Essential (primary) hypertension: Secondary | ICD-10-CM | POA: Diagnosis not present

## 2019-03-19 DIAGNOSIS — E1159 Type 2 diabetes mellitus with other circulatory complications: Secondary | ICD-10-CM | POA: Diagnosis not present

## 2019-03-21 DIAGNOSIS — H353122 Nonexudative age-related macular degeneration, left eye, intermediate dry stage: Secondary | ICD-10-CM | POA: Diagnosis not present

## 2019-03-21 DIAGNOSIS — H35373 Puckering of macula, bilateral: Secondary | ICD-10-CM | POA: Diagnosis not present

## 2019-03-21 DIAGNOSIS — H353212 Exudative age-related macular degeneration, right eye, with inactive choroidal neovascularization: Secondary | ICD-10-CM | POA: Diagnosis not present

## 2019-03-22 DIAGNOSIS — L601 Onycholysis: Secondary | ICD-10-CM | POA: Diagnosis not present

## 2019-03-22 DIAGNOSIS — L03019 Cellulitis of unspecified finger: Secondary | ICD-10-CM | POA: Diagnosis not present

## 2019-03-27 DIAGNOSIS — D510 Vitamin B12 deficiency anemia due to intrinsic factor deficiency: Secondary | ICD-10-CM | POA: Diagnosis not present

## 2019-04-02 IMAGING — MR MR HEAD W/O CM
11 series · 40 of 48 positions shown · non-contrast
Comparison: MRA head September 15, 2018 and MRI head October 16, 2015

CLINICAL DATA: Episodic word-finding difficulty confusion. History
of hypertension and diabetes.

EXAM:
MRI HEAD WITHOUT CONTRAST
TECHNIQUE: Multiplanar, multiecho pulse sequences of the brain and surrounding
structures were obtained without intravenous contrast.

[Series 5: ax dwi_tracew · axial · 3.0mm · 0.60mm/px · z∈[-41,+119]mm · 4 of 55 slices shown]
[im 1/55]
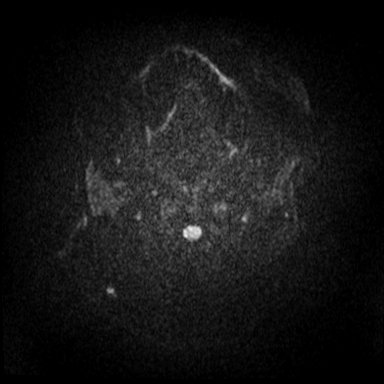
[im 19/55]
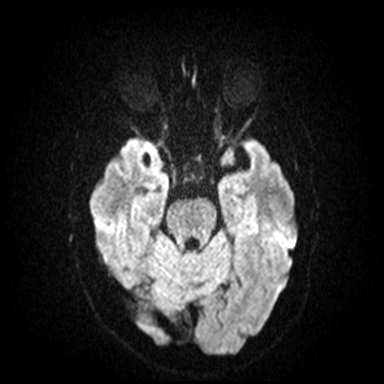
[im 37/55]
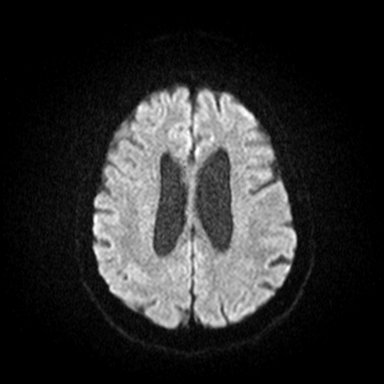
[im 55/55]
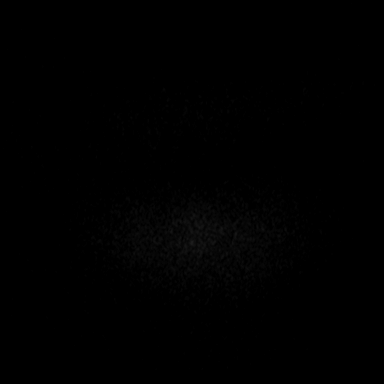

[Series 6: ax dwi_adc · axial · 3.0mm · 0.60mm/px · z∈[-41,+119]mm · 4 of 55 slices shown]
[im 1/55]
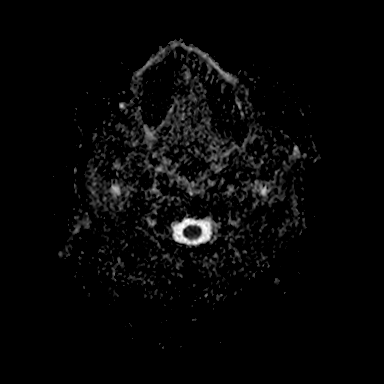
[im 19/55]
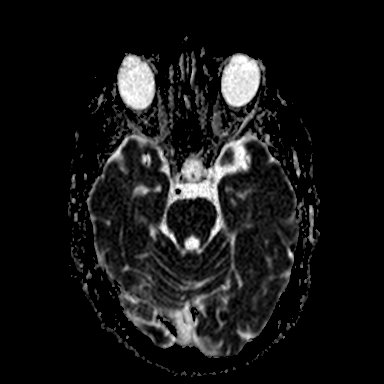
[im 37/55]
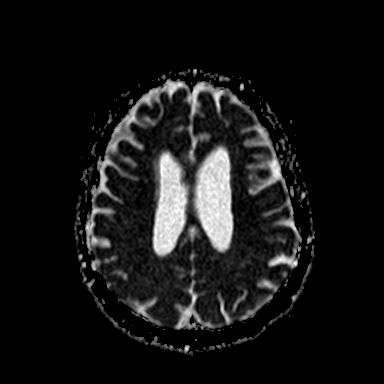
[im 55/55]
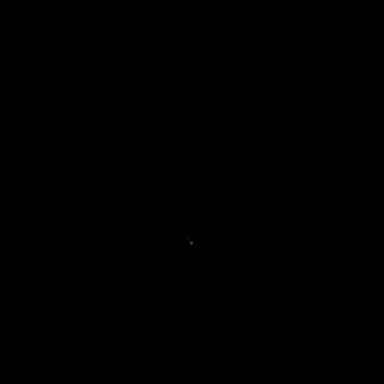

[Series 7: cor dwi_tracew · coronal · 5.0mm · 0.60mm/px · 3 of 39 slices shown]
[im 1/39]
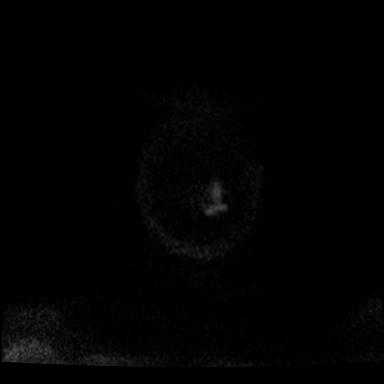
[im 20/39]
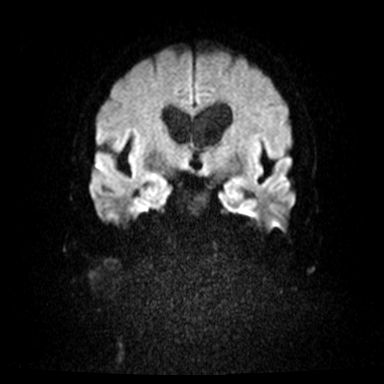
[im 39/39]
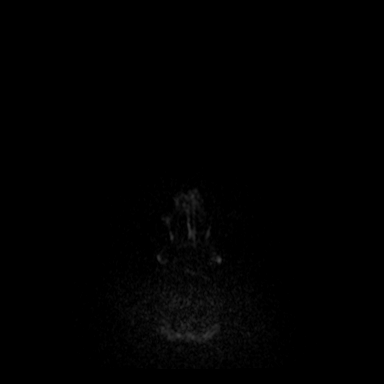

[Series 8: cor dwi_adc · coronal · 5.0mm · 0.60mm/px · 3 of 39 slices shown]
[im 1/39]
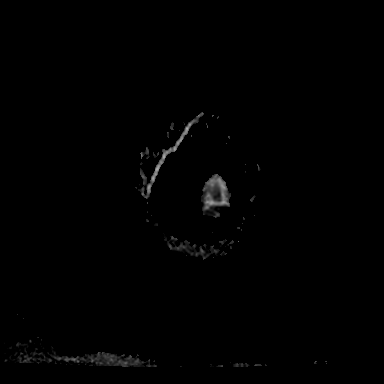
[im 20/39]
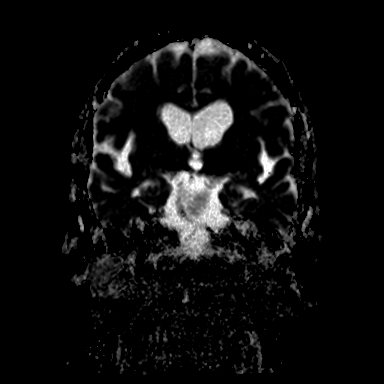
[im 39/39]
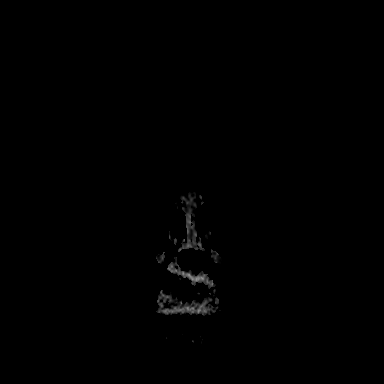

[Series 9: T1 · sagittal · 5.0mm · 0.62mm/px · 2 of 21 slices shown (1 of 2)]
[im 1/21]
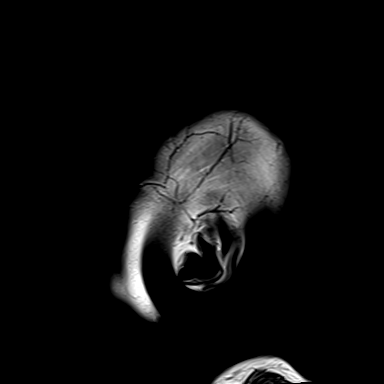
[im 21/21]
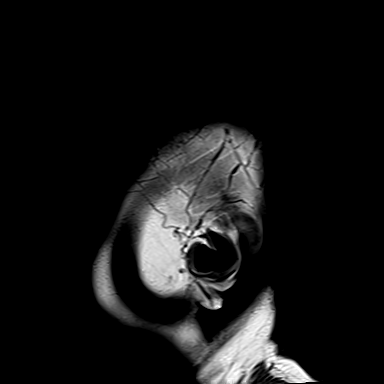

[Series 10: T2 · axial · 5.0mm · 0.53mm/px · z∈[-39,+116]mm · 2 of 27 slices shown (1 of 2)]
[im 1/27]
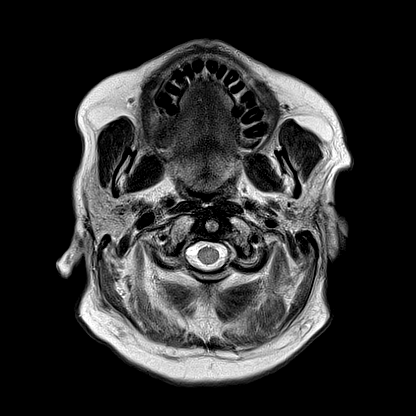
[im 27/27]
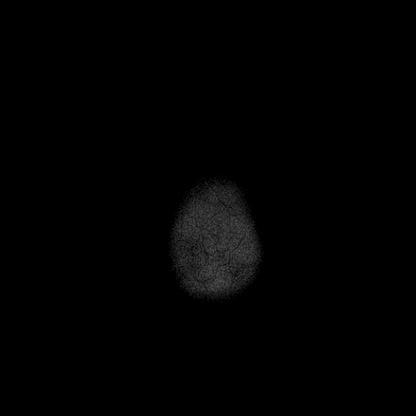

[Series 11: swi_images · axial · 3.0mm · 0.90mm/px · z∈[-43,+121]mm · 5 of 56 slices shown]
[im 1/56]
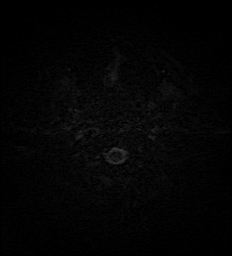
[im 14/56]
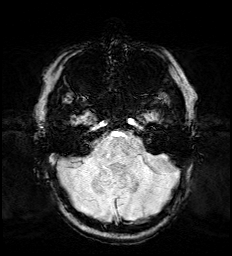
[im 28/56]
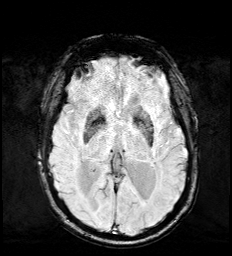
[im 42/56]
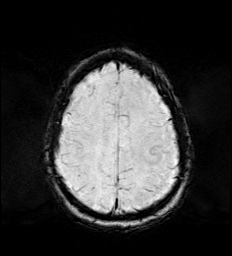
[im 56/56]
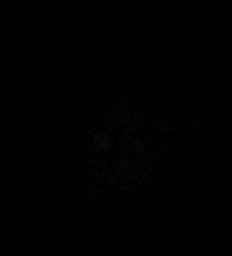

[Series 12: mip_images(sw) · axial · 24.0mm · 0.90mm/px · 1 of 49 slices shown]
[im 1/49]
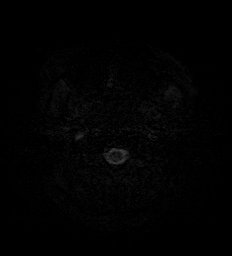

[Series 13: FLAIR · axial · 3.0mm · 0.53mm/px · z∈[-43,+120]mm · 5 of 56 slices shown]
[im 1/56]
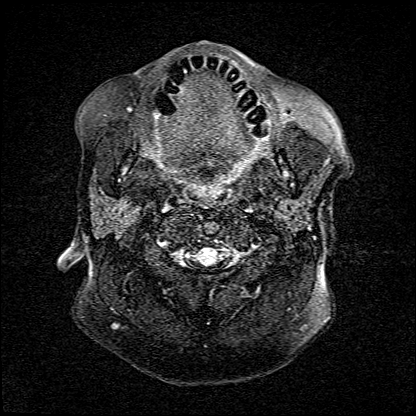
[im 14/56]
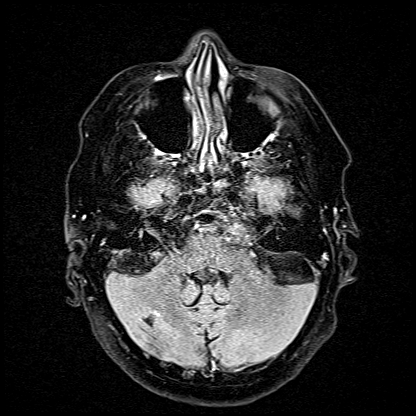
[im 28/56]
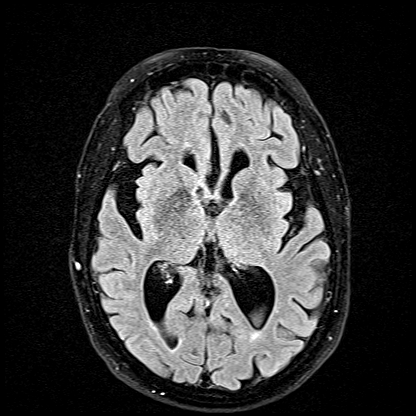
[im 42/56]
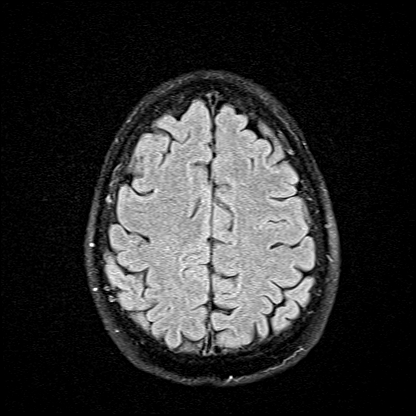
[im 56/56]
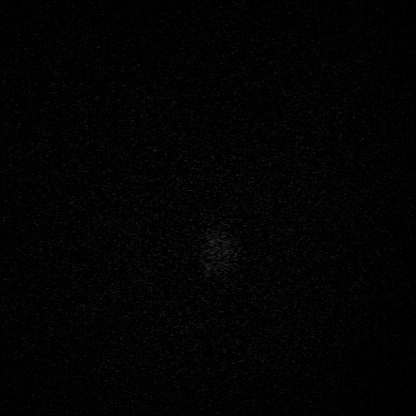

[Series 14: T1 · axial · 1.0mm · 0.98mm/px · z∈[-42,+116]mm · 8 of 160 slices shown (2 of 2)]
[im 1/160]
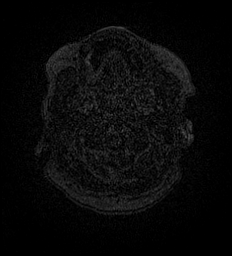
[im 27/160]
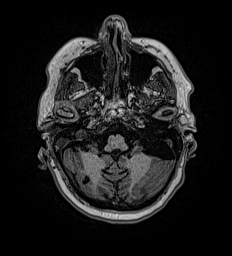
[im 54/160]
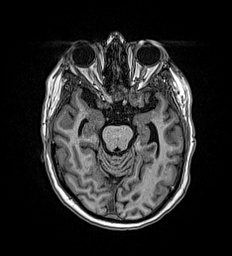
[im 67/160]
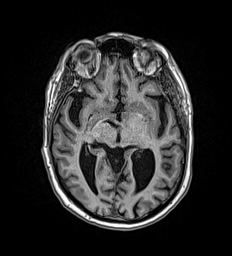
[im 93/160]
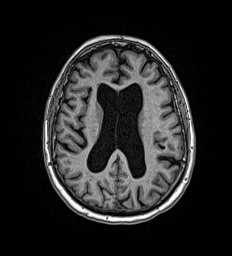
[im 107/160]
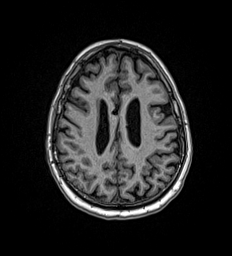
[im 133/160]
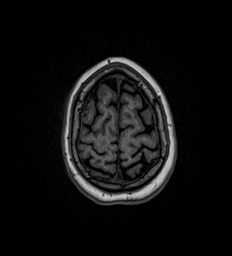
[im 160/160]
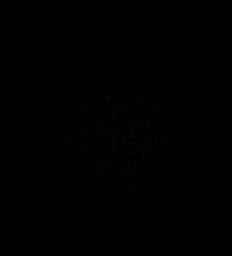

[Series 15: T2 · coronal · 5.0mm · 0.57mm/px · 3 of 31 slices shown (2 of 2)]
[im 1/31]
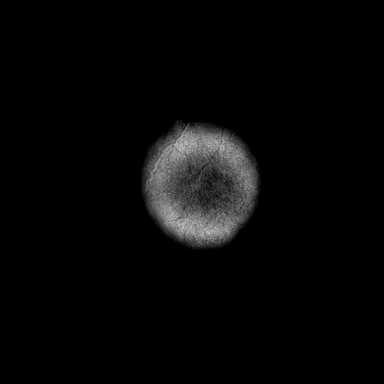
[im 16/31]
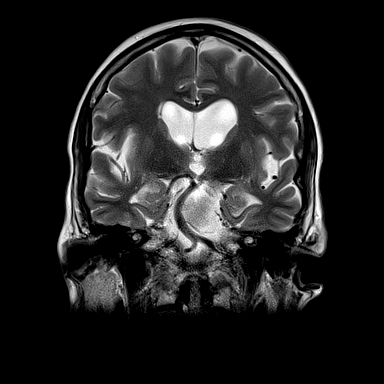
[im 31/31]
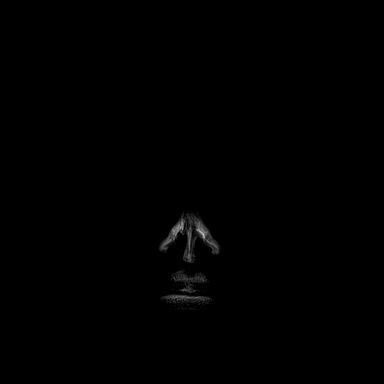

[40 of 48 positions shown; findings below may reference images not displayed]

FINDINGS: INTRACRANIAL CONTENTS: No reduced diffusion to suggest acute
ischemia. No susceptibility artifact to suggest hemorrhage. Moderate
similar parenchymal brain volume loss. No hydrocephalus. Old small
RIGHT cerebellar infarct. Faint supratentorial white matter FLAIR T2
hyperintensities compatible with mild chronic small vessel ischemic
changes, less than expected for age. New nonacute RIGHT basal
ganglia lacunar infarct no suspicious parenchymal signal, masses,
mass effect. No abnormal extra-axial fluid collections. LEFT
posterior fossa arachnoid cyst. No extra-axial masses.

VASCULAR: Major intracranial vascular flow voids present at skull
base; mild dolichoectasia associated with chronic hypertension.

SKULL AND UPPER CERVICAL SPINE: No abnormal sellar expansion. No
suspicious calvarial bone marrow signal. Craniocervical junction
maintained.

SINUSES/ORBITS: Trace RIGHT mastoid effusion. Paranasal sinuses are
well aerated. Included ocular globes and orbital contents are
non-suspicious. Status post bilateral ocular lens implants.

OTHER: None.
IMPRESSION: 1. No acute intracranial process.
2. New nonacute RIGHT basal ganglia lacunar infarct.
3. Otherwise stable examination including old small RIGHT cerebellar
infarct and moderate parenchymal brain volume loss.

## 2019-04-27 DIAGNOSIS — E1169 Type 2 diabetes mellitus with other specified complication: Secondary | ICD-10-CM | POA: Diagnosis not present

## 2019-04-27 DIAGNOSIS — E1159 Type 2 diabetes mellitus with other circulatory complications: Secondary | ICD-10-CM | POA: Diagnosis not present

## 2019-04-27 DIAGNOSIS — E1165 Type 2 diabetes mellitus with hyperglycemia: Secondary | ICD-10-CM | POA: Diagnosis not present

## 2019-04-27 DIAGNOSIS — E785 Hyperlipidemia, unspecified: Secondary | ICD-10-CM | POA: Diagnosis not present

## 2019-04-27 DIAGNOSIS — I10 Essential (primary) hypertension: Secondary | ICD-10-CM | POA: Diagnosis not present

## 2019-04-27 DIAGNOSIS — D510 Vitamin B12 deficiency anemia due to intrinsic factor deficiency: Secondary | ICD-10-CM | POA: Diagnosis not present

## 2019-05-07 DIAGNOSIS — E785 Hyperlipidemia, unspecified: Secondary | ICD-10-CM | POA: Diagnosis not present

## 2019-05-07 DIAGNOSIS — E1165 Type 2 diabetes mellitus with hyperglycemia: Secondary | ICD-10-CM | POA: Diagnosis not present

## 2019-05-07 DIAGNOSIS — E1159 Type 2 diabetes mellitus with other circulatory complications: Secondary | ICD-10-CM | POA: Diagnosis not present

## 2019-05-07 DIAGNOSIS — Z0000 Encounter for general adult medical examination without abnormal findings: Secondary | ICD-10-CM | POA: Diagnosis not present

## 2019-05-07 DIAGNOSIS — M7662 Achilles tendinitis, left leg: Secondary | ICD-10-CM | POA: Diagnosis not present

## 2019-05-07 DIAGNOSIS — I10 Essential (primary) hypertension: Secondary | ICD-10-CM | POA: Diagnosis not present

## 2019-05-07 DIAGNOSIS — G459 Transient cerebral ischemic attack, unspecified: Secondary | ICD-10-CM | POA: Diagnosis not present

## 2019-05-07 DIAGNOSIS — E1169 Type 2 diabetes mellitus with other specified complication: Secondary | ICD-10-CM | POA: Diagnosis not present

## 2019-05-23 DIAGNOSIS — M25872 Other specified joint disorders, left ankle and foot: Secondary | ICD-10-CM | POA: Diagnosis not present

## 2019-05-23 DIAGNOSIS — M25572 Pain in left ankle and joints of left foot: Secondary | ICD-10-CM | POA: Diagnosis not present

## 2019-05-23 DIAGNOSIS — E1142 Type 2 diabetes mellitus with diabetic polyneuropathy: Secondary | ICD-10-CM | POA: Diagnosis not present

## 2019-05-23 DIAGNOSIS — M67372 Transient synovitis, left ankle and foot: Secondary | ICD-10-CM | POA: Diagnosis not present

## 2019-05-24 DIAGNOSIS — L820 Inflamed seborrheic keratosis: Secondary | ICD-10-CM | POA: Diagnosis not present

## 2019-05-24 DIAGNOSIS — L609 Nail disorder, unspecified: Secondary | ICD-10-CM | POA: Diagnosis not present

## 2019-06-04 DIAGNOSIS — D510 Vitamin B12 deficiency anemia due to intrinsic factor deficiency: Secondary | ICD-10-CM | POA: Diagnosis not present

## 2019-06-12 DIAGNOSIS — M7662 Achilles tendinitis, left leg: Secondary | ICD-10-CM | POA: Diagnosis not present

## 2019-06-13 DIAGNOSIS — H353131 Nonexudative age-related macular degeneration, bilateral, early dry stage: Secondary | ICD-10-CM | POA: Diagnosis not present

## 2019-06-14 DIAGNOSIS — G459 Transient cerebral ischemic attack, unspecified: Secondary | ICD-10-CM | POA: Diagnosis not present

## 2019-06-14 DIAGNOSIS — R42 Dizziness and giddiness: Secondary | ICD-10-CM | POA: Diagnosis not present

## 2019-06-14 DIAGNOSIS — I639 Cerebral infarction, unspecified: Secondary | ICD-10-CM | POA: Diagnosis not present

## 2019-06-14 DIAGNOSIS — K219 Gastro-esophageal reflux disease without esophagitis: Secondary | ICD-10-CM | POA: Diagnosis not present

## 2019-06-20 DIAGNOSIS — H353211 Exudative age-related macular degeneration, right eye, with active choroidal neovascularization: Secondary | ICD-10-CM | POA: Diagnosis not present

## 2019-06-20 DIAGNOSIS — H35423 Microcystoid degeneration of retina, bilateral: Secondary | ICD-10-CM | POA: Diagnosis not present

## 2019-06-20 DIAGNOSIS — H353122 Nonexudative age-related macular degeneration, left eye, intermediate dry stage: Secondary | ICD-10-CM | POA: Diagnosis not present

## 2019-06-20 DIAGNOSIS — H35373 Puckering of macula, bilateral: Secondary | ICD-10-CM | POA: Diagnosis not present

## 2019-06-27 DIAGNOSIS — E785 Hyperlipidemia, unspecified: Secondary | ICD-10-CM | POA: Diagnosis not present

## 2019-06-27 DIAGNOSIS — E1165 Type 2 diabetes mellitus with hyperglycemia: Secondary | ICD-10-CM | POA: Diagnosis not present

## 2019-06-27 DIAGNOSIS — E1159 Type 2 diabetes mellitus with other circulatory complications: Secondary | ICD-10-CM | POA: Diagnosis not present

## 2019-06-27 DIAGNOSIS — E1342 Other specified diabetes mellitus with diabetic polyneuropathy: Secondary | ICD-10-CM | POA: Diagnosis not present

## 2019-06-27 DIAGNOSIS — E669 Obesity, unspecified: Secondary | ICD-10-CM | POA: Diagnosis not present

## 2019-06-27 DIAGNOSIS — I10 Essential (primary) hypertension: Secondary | ICD-10-CM | POA: Diagnosis not present

## 2019-06-27 DIAGNOSIS — E1169 Type 2 diabetes mellitus with other specified complication: Secondary | ICD-10-CM | POA: Diagnosis not present

## 2019-07-04 DIAGNOSIS — H353211 Exudative age-related macular degeneration, right eye, with active choroidal neovascularization: Secondary | ICD-10-CM | POA: Diagnosis not present

## 2019-07-05 DIAGNOSIS — D510 Vitamin B12 deficiency anemia due to intrinsic factor deficiency: Secondary | ICD-10-CM | POA: Diagnosis not present

## 2019-07-23 DIAGNOSIS — L650 Telogen effluvium: Secondary | ICD-10-CM | POA: Diagnosis not present

## 2019-07-23 DIAGNOSIS — L649 Androgenic alopecia, unspecified: Secondary | ICD-10-CM | POA: Diagnosis not present

## 2019-07-23 DIAGNOSIS — E119 Type 2 diabetes mellitus without complications: Secondary | ICD-10-CM | POA: Diagnosis not present

## 2019-07-30 DIAGNOSIS — M7662 Achilles tendinitis, left leg: Secondary | ICD-10-CM | POA: Diagnosis not present

## 2019-08-08 DIAGNOSIS — Z8673 Personal history of transient ischemic attack (TIA), and cerebral infarction without residual deficits: Secondary | ICD-10-CM | POA: Diagnosis not present

## 2019-08-08 DIAGNOSIS — R42 Dizziness and giddiness: Secondary | ICD-10-CM | POA: Diagnosis not present

## 2019-08-08 DIAGNOSIS — E538 Deficiency of other specified B group vitamins: Secondary | ICD-10-CM | POA: Diagnosis not present

## 2019-08-08 DIAGNOSIS — D510 Vitamin B12 deficiency anemia due to intrinsic factor deficiency: Secondary | ICD-10-CM | POA: Diagnosis not present

## 2019-08-14 DIAGNOSIS — E119 Type 2 diabetes mellitus without complications: Secondary | ICD-10-CM | POA: Diagnosis not present

## 2019-08-14 DIAGNOSIS — L649 Androgenic alopecia, unspecified: Secondary | ICD-10-CM | POA: Diagnosis not present

## 2019-08-14 DIAGNOSIS — L650 Telogen effluvium: Secondary | ICD-10-CM | POA: Diagnosis not present

## 2019-08-29 DIAGNOSIS — H43813 Vitreous degeneration, bilateral: Secondary | ICD-10-CM | POA: Diagnosis not present

## 2019-08-29 DIAGNOSIS — H35373 Puckering of macula, bilateral: Secondary | ICD-10-CM | POA: Diagnosis not present

## 2019-08-29 DIAGNOSIS — H353211 Exudative age-related macular degeneration, right eye, with active choroidal neovascularization: Secondary | ICD-10-CM | POA: Diagnosis not present

## 2019-08-29 DIAGNOSIS — H353122 Nonexudative age-related macular degeneration, left eye, intermediate dry stage: Secondary | ICD-10-CM | POA: Diagnosis not present

## 2019-08-31 DIAGNOSIS — Z23 Encounter for immunization: Secondary | ICD-10-CM | POA: Diagnosis not present

## 2019-09-12 DIAGNOSIS — E1142 Type 2 diabetes mellitus with diabetic polyneuropathy: Secondary | ICD-10-CM | POA: Diagnosis not present

## 2019-09-12 DIAGNOSIS — E538 Deficiency of other specified B group vitamins: Secondary | ICD-10-CM | POA: Diagnosis not present

## 2019-09-12 DIAGNOSIS — D510 Vitamin B12 deficiency anemia due to intrinsic factor deficiency: Secondary | ICD-10-CM | POA: Diagnosis not present

## 2019-09-12 DIAGNOSIS — M7662 Achilles tendinitis, left leg: Secondary | ICD-10-CM | POA: Diagnosis not present

## 2019-09-12 DIAGNOSIS — B351 Tinea unguium: Secondary | ICD-10-CM | POA: Diagnosis not present

## 2019-09-24 DIAGNOSIS — E785 Hyperlipidemia, unspecified: Secondary | ICD-10-CM | POA: Diagnosis not present

## 2019-09-24 DIAGNOSIS — E1169 Type 2 diabetes mellitus with other specified complication: Secondary | ICD-10-CM | POA: Diagnosis not present

## 2019-09-24 DIAGNOSIS — E1165 Type 2 diabetes mellitus with hyperglycemia: Secondary | ICD-10-CM | POA: Diagnosis not present

## 2019-10-01 DIAGNOSIS — E1342 Other specified diabetes mellitus with diabetic polyneuropathy: Secondary | ICD-10-CM | POA: Diagnosis not present

## 2019-10-01 DIAGNOSIS — I10 Essential (primary) hypertension: Secondary | ICD-10-CM | POA: Diagnosis not present

## 2019-10-01 DIAGNOSIS — E1169 Type 2 diabetes mellitus with other specified complication: Secondary | ICD-10-CM | POA: Diagnosis not present

## 2019-10-01 DIAGNOSIS — E1165 Type 2 diabetes mellitus with hyperglycemia: Secondary | ICD-10-CM | POA: Diagnosis not present

## 2019-10-01 DIAGNOSIS — E785 Hyperlipidemia, unspecified: Secondary | ICD-10-CM | POA: Diagnosis not present

## 2019-10-01 DIAGNOSIS — E669 Obesity, unspecified: Secondary | ICD-10-CM | POA: Diagnosis not present

## 2019-10-01 DIAGNOSIS — E1159 Type 2 diabetes mellitus with other circulatory complications: Secondary | ICD-10-CM | POA: Diagnosis not present

## 2019-10-15 DIAGNOSIS — D510 Vitamin B12 deficiency anemia due to intrinsic factor deficiency: Secondary | ICD-10-CM | POA: Diagnosis not present

## 2019-10-18 ENCOUNTER — Other Ambulatory Visit: Payer: Self-pay | Admitting: Physician Assistant

## 2019-10-18 DIAGNOSIS — Z1231 Encounter for screening mammogram for malignant neoplasm of breast: Secondary | ICD-10-CM

## 2019-10-23 DIAGNOSIS — H353122 Nonexudative age-related macular degeneration, left eye, intermediate dry stage: Secondary | ICD-10-CM | POA: Diagnosis not present

## 2019-10-23 DIAGNOSIS — H353211 Exudative age-related macular degeneration, right eye, with active choroidal neovascularization: Secondary | ICD-10-CM | POA: Diagnosis not present

## 2019-10-23 DIAGNOSIS — H43813 Vitreous degeneration, bilateral: Secondary | ICD-10-CM | POA: Diagnosis not present

## 2019-10-23 DIAGNOSIS — H35373 Puckering of macula, bilateral: Secondary | ICD-10-CM | POA: Diagnosis not present

## 2019-10-31 DIAGNOSIS — I10 Essential (primary) hypertension: Secondary | ICD-10-CM | POA: Diagnosis not present

## 2019-10-31 DIAGNOSIS — E1165 Type 2 diabetes mellitus with hyperglycemia: Secondary | ICD-10-CM | POA: Diagnosis not present

## 2019-10-31 DIAGNOSIS — E1159 Type 2 diabetes mellitus with other circulatory complications: Secondary | ICD-10-CM | POA: Diagnosis not present

## 2019-11-01 DIAGNOSIS — R42 Dizziness and giddiness: Secondary | ICD-10-CM | POA: Diagnosis not present

## 2019-11-07 DIAGNOSIS — E785 Hyperlipidemia, unspecified: Secondary | ICD-10-CM | POA: Diagnosis not present

## 2019-11-07 DIAGNOSIS — G459 Transient cerebral ischemic attack, unspecified: Secondary | ICD-10-CM | POA: Diagnosis not present

## 2019-11-07 DIAGNOSIS — Z1239 Encounter for other screening for malignant neoplasm of breast: Secondary | ICD-10-CM | POA: Diagnosis not present

## 2019-11-07 DIAGNOSIS — E1169 Type 2 diabetes mellitus with other specified complication: Secondary | ICD-10-CM | POA: Diagnosis not present

## 2019-11-07 DIAGNOSIS — Z0000 Encounter for general adult medical examination without abnormal findings: Secondary | ICD-10-CM | POA: Diagnosis not present

## 2019-11-07 DIAGNOSIS — M7662 Achilles tendinitis, left leg: Secondary | ICD-10-CM | POA: Diagnosis not present

## 2019-11-07 DIAGNOSIS — I10 Essential (primary) hypertension: Secondary | ICD-10-CM | POA: Diagnosis not present

## 2019-11-07 DIAGNOSIS — E1165 Type 2 diabetes mellitus with hyperglycemia: Secondary | ICD-10-CM | POA: Diagnosis not present

## 2019-11-08 DIAGNOSIS — E1165 Type 2 diabetes mellitus with hyperglycemia: Secondary | ICD-10-CM | POA: Diagnosis not present

## 2019-11-15 DIAGNOSIS — D510 Vitamin B12 deficiency anemia due to intrinsic factor deficiency: Secondary | ICD-10-CM | POA: Diagnosis not present

## 2019-11-15 DIAGNOSIS — E538 Deficiency of other specified B group vitamins: Secondary | ICD-10-CM | POA: Diagnosis not present

## 2019-11-16 DIAGNOSIS — R42 Dizziness and giddiness: Secondary | ICD-10-CM | POA: Diagnosis not present

## 2019-11-26 DIAGNOSIS — R42 Dizziness and giddiness: Secondary | ICD-10-CM | POA: Diagnosis not present

## 2019-12-12 DIAGNOSIS — B351 Tinea unguium: Secondary | ICD-10-CM | POA: Diagnosis not present

## 2019-12-12 DIAGNOSIS — M7662 Achilles tendinitis, left leg: Secondary | ICD-10-CM | POA: Diagnosis not present

## 2019-12-12 DIAGNOSIS — M25872 Other specified joint disorders, left ankle and foot: Secondary | ICD-10-CM | POA: Diagnosis not present

## 2019-12-12 DIAGNOSIS — E1142 Type 2 diabetes mellitus with diabetic polyneuropathy: Secondary | ICD-10-CM | POA: Diagnosis not present

## 2019-12-14 ENCOUNTER — Ambulatory Visit
Admission: RE | Admit: 2019-12-14 | Discharge: 2019-12-14 | Disposition: A | Discharge status: Home / Self Care | Payer: PPO | Source: Ambulatory Visit | Attending: Physician Assistant | Admitting: Physician Assistant

## 2019-12-14 DIAGNOSIS — Z1231 Encounter for screening mammogram for malignant neoplasm of breast: Secondary | ICD-10-CM | POA: Insufficient documentation

## 2019-12-18 DIAGNOSIS — D510 Vitamin B12 deficiency anemia due to intrinsic factor deficiency: Secondary | ICD-10-CM | POA: Diagnosis not present

## 2019-12-25 DIAGNOSIS — H35373 Puckering of macula, bilateral: Secondary | ICD-10-CM | POA: Diagnosis not present

## 2019-12-25 DIAGNOSIS — H353122 Nonexudative age-related macular degeneration, left eye, intermediate dry stage: Secondary | ICD-10-CM | POA: Diagnosis not present

## 2019-12-25 DIAGNOSIS — H43813 Vitreous degeneration, bilateral: Secondary | ICD-10-CM | POA: Diagnosis not present

## 2019-12-25 DIAGNOSIS — H353211 Exudative age-related macular degeneration, right eye, with active choroidal neovascularization: Secondary | ICD-10-CM | POA: Diagnosis not present

## 2020-01-09 DIAGNOSIS — E1169 Type 2 diabetes mellitus with other specified complication: Secondary | ICD-10-CM | POA: Diagnosis not present

## 2020-01-09 DIAGNOSIS — E1165 Type 2 diabetes mellitus with hyperglycemia: Secondary | ICD-10-CM | POA: Diagnosis not present

## 2020-01-09 DIAGNOSIS — E785 Hyperlipidemia, unspecified: Secondary | ICD-10-CM | POA: Diagnosis not present

## 2020-01-09 DIAGNOSIS — I10 Essential (primary) hypertension: Secondary | ICD-10-CM | POA: Diagnosis not present

## 2020-01-09 DIAGNOSIS — E669 Obesity, unspecified: Secondary | ICD-10-CM | POA: Diagnosis not present

## 2020-01-09 DIAGNOSIS — E1159 Type 2 diabetes mellitus with other circulatory complications: Secondary | ICD-10-CM | POA: Diagnosis not present

## 2020-01-09 DIAGNOSIS — E1342 Other specified diabetes mellitus with diabetic polyneuropathy: Secondary | ICD-10-CM | POA: Diagnosis not present

## 2020-01-18 DIAGNOSIS — D510 Vitamin B12 deficiency anemia due to intrinsic factor deficiency: Secondary | ICD-10-CM | POA: Diagnosis not present

## 2020-01-24 DIAGNOSIS — M170 Bilateral primary osteoarthritis of knee: Secondary | ICD-10-CM | POA: Diagnosis not present

## 2020-01-24 DIAGNOSIS — Z96653 Presence of artificial knee joint, bilateral: Secondary | ICD-10-CM | POA: Diagnosis not present

## 2020-01-30 DIAGNOSIS — L650 Telogen effluvium: Secondary | ICD-10-CM | POA: Diagnosis not present

## 2020-01-30 DIAGNOSIS — L649 Androgenic alopecia, unspecified: Secondary | ICD-10-CM | POA: Diagnosis not present

## 2020-02-18 DIAGNOSIS — D510 Vitamin B12 deficiency anemia due to intrinsic factor deficiency: Secondary | ICD-10-CM | POA: Diagnosis not present

## 2020-03-17 DIAGNOSIS — E1142 Type 2 diabetes mellitus with diabetic polyneuropathy: Secondary | ICD-10-CM | POA: Diagnosis not present

## 2020-03-17 DIAGNOSIS — L600 Ingrowing nail: Secondary | ICD-10-CM | POA: Diagnosis not present

## 2020-03-17 DIAGNOSIS — B351 Tinea unguium: Secondary | ICD-10-CM | POA: Diagnosis not present

## 2020-03-18 DIAGNOSIS — H43393 Other vitreous opacities, bilateral: Secondary | ICD-10-CM | POA: Diagnosis not present

## 2020-03-18 DIAGNOSIS — H353211 Exudative age-related macular degeneration, right eye, with active choroidal neovascularization: Secondary | ICD-10-CM | POA: Diagnosis not present

## 2020-03-18 DIAGNOSIS — H35373 Puckering of macula, bilateral: Secondary | ICD-10-CM | POA: Diagnosis not present

## 2020-03-18 DIAGNOSIS — H353122 Nonexudative age-related macular degeneration, left eye, intermediate dry stage: Secondary | ICD-10-CM | POA: Diagnosis not present

## 2020-03-20 DIAGNOSIS — D510 Vitamin B12 deficiency anemia due to intrinsic factor deficiency: Secondary | ICD-10-CM | POA: Diagnosis not present

## 2020-04-16 DIAGNOSIS — E1159 Type 2 diabetes mellitus with other circulatory complications: Secondary | ICD-10-CM | POA: Diagnosis not present

## 2020-04-16 DIAGNOSIS — E669 Obesity, unspecified: Secondary | ICD-10-CM | POA: Diagnosis not present

## 2020-04-16 DIAGNOSIS — E1165 Type 2 diabetes mellitus with hyperglycemia: Secondary | ICD-10-CM | POA: Diagnosis not present

## 2020-04-16 DIAGNOSIS — E785 Hyperlipidemia, unspecified: Secondary | ICD-10-CM | POA: Diagnosis not present

## 2020-04-16 DIAGNOSIS — I152 Hypertension secondary to endocrine disorders: Secondary | ICD-10-CM | POA: Diagnosis not present

## 2020-04-16 DIAGNOSIS — E1169 Type 2 diabetes mellitus with other specified complication: Secondary | ICD-10-CM | POA: Diagnosis not present

## 2020-04-16 DIAGNOSIS — E1142 Type 2 diabetes mellitus with diabetic polyneuropathy: Secondary | ICD-10-CM | POA: Diagnosis not present

## 2020-04-21 DIAGNOSIS — D510 Vitamin B12 deficiency anemia due to intrinsic factor deficiency: Secondary | ICD-10-CM | POA: Diagnosis not present

## 2020-04-21 DIAGNOSIS — E538 Deficiency of other specified B group vitamins: Secondary | ICD-10-CM | POA: Diagnosis not present

## 2020-04-30 DIAGNOSIS — E1165 Type 2 diabetes mellitus with hyperglycemia: Secondary | ICD-10-CM | POA: Diagnosis not present

## 2020-04-30 DIAGNOSIS — E1169 Type 2 diabetes mellitus with other specified complication: Secondary | ICD-10-CM | POA: Diagnosis not present

## 2020-04-30 DIAGNOSIS — I10 Essential (primary) hypertension: Secondary | ICD-10-CM | POA: Diagnosis not present

## 2020-04-30 DIAGNOSIS — E785 Hyperlipidemia, unspecified: Secondary | ICD-10-CM | POA: Diagnosis not present

## 2020-05-07 DIAGNOSIS — E1169 Type 2 diabetes mellitus with other specified complication: Secondary | ICD-10-CM | POA: Diagnosis not present

## 2020-05-07 DIAGNOSIS — I10 Essential (primary) hypertension: Secondary | ICD-10-CM | POA: Diagnosis not present

## 2020-05-07 DIAGNOSIS — I6381 Other cerebral infarction due to occlusion or stenosis of small artery: Secondary | ICD-10-CM | POA: Diagnosis not present

## 2020-05-07 DIAGNOSIS — Z0000 Encounter for general adult medical examination without abnormal findings: Secondary | ICD-10-CM | POA: Diagnosis not present

## 2020-05-07 DIAGNOSIS — E1165 Type 2 diabetes mellitus with hyperglycemia: Secondary | ICD-10-CM | POA: Diagnosis not present

## 2020-05-07 DIAGNOSIS — E785 Hyperlipidemia, unspecified: Secondary | ICD-10-CM | POA: Diagnosis not present

## 2020-05-07 DIAGNOSIS — K219 Gastro-esophageal reflux disease without esophagitis: Secondary | ICD-10-CM | POA: Diagnosis not present

## 2020-05-12 DIAGNOSIS — K219 Gastro-esophageal reflux disease without esophagitis: Secondary | ICD-10-CM | POA: Diagnosis not present

## 2020-05-23 DIAGNOSIS — D510 Vitamin B12 deficiency anemia due to intrinsic factor deficiency: Secondary | ICD-10-CM | POA: Diagnosis not present

## 2020-06-16 DIAGNOSIS — B351 Tinea unguium: Secondary | ICD-10-CM | POA: Diagnosis not present

## 2020-06-16 DIAGNOSIS — E1142 Type 2 diabetes mellitus with diabetic polyneuropathy: Secondary | ICD-10-CM | POA: Diagnosis not present

## 2020-06-19 DIAGNOSIS — H353131 Nonexudative age-related macular degeneration, bilateral, early dry stage: Secondary | ICD-10-CM | POA: Diagnosis not present

## 2020-06-23 DIAGNOSIS — D510 Vitamin B12 deficiency anemia due to intrinsic factor deficiency: Secondary | ICD-10-CM | POA: Diagnosis not present

## 2020-07-16 ENCOUNTER — Other Ambulatory Visit: Payer: Self-pay

## 2020-07-16 ENCOUNTER — Ambulatory Visit: Payer: PPO | Admitting: Dermatology

## 2020-07-16 DIAGNOSIS — L820 Inflamed seborrheic keratosis: Secondary | ICD-10-CM | POA: Diagnosis not present

## 2020-07-16 DIAGNOSIS — L304 Erythema intertrigo: Secondary | ICD-10-CM | POA: Diagnosis not present

## 2020-07-16 MED ORDER — HYDROCORTISONE 2.5 % EX CREA: TOPICAL_CREAM | Freq: Every day | CUTANEOUS | 1 refills | Status: DC

## 2020-07-16 MED ORDER — FLUCONAZOLE 200 MG PO TABS: 200.0000 mg | ORAL_TABLET | ORAL | 0 refills | Status: DC

## 2020-07-16 MED ORDER — KETOCONAZOLE 2 % EX CREA: 1.0000 "application " | TOPICAL_CREAM | Freq: Every morning | CUTANEOUS | 0 refills | Status: AC

## 2020-07-16 NOTE — Progress Notes (Signed)
   Follow-Up Visit   Subjective  Laura Mcdaniel is a 74 y.o. female who presents for the following: Rash (groin, ~2wks, itchy and painful, using gold bound medicated powder).  The following portions of the chart were reviewed this encounter and updated as appropriate:  Tobacco  Allergies  Meds  Problems  Med Hx  Surg Hx  Fam Hx     Review of Systems:  No other skin or systemic complaints except as noted in HPI or Assessment and Plan.  Objective  Well appearing patient in no apparent distress; mood and affect are within normal limits.  A focused examination was performed including groin. Relevant physical exam findings are noted in the Assessment and Plan.  Objective  groin: Bright red erythema with maceration groin  Objective  L groin x 17 (17): Erythematous keratotic or waxy stuck-on papule or plaque.    Assessment & Plan  Intertrigo with Candida groin Start Diflucan 200mg  1 po 3 times a week x 1 wk Start Ketoconazole 2% qam for 2 weeks Start HC 2.5% cr qhs for 2 weeks  fluconazole (DIFLUCAN) 200 MG tablet - groin  ketoconazole (NIZORAL) 2 % cream - groin  hydrocortisone 2.5 % cream - groin  Inflamed seborrheic keratosis (17) L groin x 17  Destruction of lesion - L groin x 17 Complexity: simple   Destruction method: cryotherapy   Informed consent: discussed and consent obtained   Timeout:  patient name, date of birth, surgical site, and procedure verified Lesion destroyed using liquid nitrogen: Yes   Region frozen until ice ball extended beyond lesion: Yes   Outcome: patient tolerated procedure well with no complications   Post-procedure details: wound care instructions given    Return in about 6 weeks (around 08/27/2020) for Intertrigo and treat ISKs on R groin.   I, Othelia Pulling, RMA, am acting as scribe for Sarina Ser, MD .  Documentation: I have reviewed the above documentation for accuracy and completeness, and I agree with the above.  Sarina Ser, MD

## 2020-07-21 DIAGNOSIS — E785 Hyperlipidemia, unspecified: Secondary | ICD-10-CM | POA: Diagnosis not present

## 2020-07-21 DIAGNOSIS — E1169 Type 2 diabetes mellitus with other specified complication: Secondary | ICD-10-CM | POA: Diagnosis not present

## 2020-07-21 DIAGNOSIS — I152 Hypertension secondary to endocrine disorders: Secondary | ICD-10-CM | POA: Diagnosis not present

## 2020-07-21 DIAGNOSIS — E1159 Type 2 diabetes mellitus with other circulatory complications: Secondary | ICD-10-CM | POA: Diagnosis not present

## 2020-07-21 DIAGNOSIS — E669 Obesity, unspecified: Secondary | ICD-10-CM | POA: Diagnosis not present

## 2020-07-21 DIAGNOSIS — E1142 Type 2 diabetes mellitus with diabetic polyneuropathy: Secondary | ICD-10-CM | POA: Diagnosis not present

## 2020-07-22 ENCOUNTER — Encounter: Payer: Self-pay | Admitting: Dermatology

## 2020-07-24 DIAGNOSIS — H43813 Vitreous degeneration, bilateral: Secondary | ICD-10-CM | POA: Diagnosis not present

## 2020-07-24 DIAGNOSIS — H43393 Other vitreous opacities, bilateral: Secondary | ICD-10-CM | POA: Diagnosis not present

## 2020-07-24 DIAGNOSIS — H353212 Exudative age-related macular degeneration, right eye, with inactive choroidal neovascularization: Secondary | ICD-10-CM | POA: Diagnosis not present

## 2020-07-24 DIAGNOSIS — H353122 Nonexudative age-related macular degeneration, left eye, intermediate dry stage: Secondary | ICD-10-CM | POA: Diagnosis not present

## 2020-07-28 DIAGNOSIS — D510 Vitamin B12 deficiency anemia due to intrinsic factor deficiency: Secondary | ICD-10-CM | POA: Diagnosis not present

## 2020-08-06 DIAGNOSIS — E1165 Type 2 diabetes mellitus with hyperglycemia: Secondary | ICD-10-CM | POA: Diagnosis not present

## 2020-08-06 DIAGNOSIS — Z23 Encounter for immunization: Secondary | ICD-10-CM | POA: Diagnosis not present

## 2020-08-06 DIAGNOSIS — I10 Essential (primary) hypertension: Secondary | ICD-10-CM | POA: Diagnosis not present

## 2020-08-06 DIAGNOSIS — E669 Obesity, unspecified: Secondary | ICD-10-CM | POA: Diagnosis not present

## 2020-08-06 DIAGNOSIS — M1990 Unspecified osteoarthritis, unspecified site: Secondary | ICD-10-CM | POA: Diagnosis not present

## 2020-08-06 DIAGNOSIS — E1169 Type 2 diabetes mellitus with other specified complication: Secondary | ICD-10-CM | POA: Diagnosis not present

## 2020-08-06 DIAGNOSIS — E785 Hyperlipidemia, unspecified: Secondary | ICD-10-CM | POA: Diagnosis not present

## 2020-08-27 ENCOUNTER — Other Ambulatory Visit: Payer: Self-pay

## 2020-08-27 ENCOUNTER — Ambulatory Visit: Payer: PPO | Admitting: Dermatology

## 2020-08-27 ENCOUNTER — Encounter: Payer: Self-pay | Admitting: Dermatology

## 2020-08-27 DIAGNOSIS — L578 Other skin changes due to chronic exposure to nonionizing radiation: Secondary | ICD-10-CM

## 2020-08-27 DIAGNOSIS — L820 Inflamed seborrheic keratosis: Secondary | ICD-10-CM | POA: Diagnosis not present

## 2020-08-27 DIAGNOSIS — L821 Other seborrheic keratosis: Secondary | ICD-10-CM

## 2020-08-27 DIAGNOSIS — L304 Erythema intertrigo: Secondary | ICD-10-CM | POA: Diagnosis not present

## 2020-08-27 MED ORDER — KETOCONAZOLE 2 % EX CREA: TOPICAL_CREAM | CUTANEOUS | 1 refills | Status: DC

## 2020-08-27 NOTE — Progress Notes (Signed)
   Follow-Up Visit   Subjective  Laura Mcdaniel is a 74 y.o. female who presents for the following: ISK's (groin x 17 - check for any new or persistent skin lesions ), intertrigo (groin - improved with Ketoconazole, HC cream x 2 wks, and Diflucan QOD x 1 week.), and lesion (R inframammary - irregular and irritated).  The following portions of the chart were reviewed this encounter and updated as appropriate:  Tobacco  Allergies  Meds  Problems  Med Hx  Surg Hx  Fam Hx     Review of Systems:  No other skin or systemic complaints except as noted in HPI or Assessment and Plan.  Objective  Well appearing patient in no apparent distress; mood and affect are within normal limits.  A focused examination was performed including the groin and trunk. Relevant physical exam findings are noted in the Assessment and Plan.  Objective  Groin: Clear today   Objective  R inframammary x 2, groin x 16 (18): Erythematous keratotic or waxy stuck-on papule or plaque.   Assessment & Plan  Erythema intertrigo -improved but persistent.  Not to goal Groin Exacerbated by irritated seborrheic keratoses -  Continue Ketoconazole 2% cream QD PRN flares, and HC 2.5% cream QD PRN flares.   ketoconazole (NIZORAL) 2 % cream - Groin  Inflamed seborrheic keratosis (18) R inframammary x 2, groin x 16  Destruction of lesion - R inframammary x 2, groin x 16 Complexity: simple   Destruction method: cryotherapy   Informed consent: discussed and consent obtained   Timeout:  patient name, date of birth, surgical site, and procedure verified Lesion destroyed using liquid nitrogen: Yes   Region frozen until ice ball extended beyond lesion: Yes   Outcome: patient tolerated procedure well with no complications   Post-procedure details: wound care instructions given     Seborrheic Keratoses - Stuck-on, waxy, tan-brown papules and plaques  - Discussed benign etiology and prognosis. - Observe - Call for any  changes  Actinic Damage - diffuse scaly erythematous macules with underlying dyspigmentation - Recommend daily broad spectrum sunscreen SPF 30+ to sun-exposed areas, reapply every 2 hours as needed.  - Call for new or changing lesions.  Return for F/U appt. - 6-8 weeks.  Luther Redo, CMA, am acting as scribe for Sarina Ser, MD .  Documentation: I have reviewed the above documentation for accuracy and completeness, and I agree with the above.  Sarina Ser, MD

## 2020-08-28 DIAGNOSIS — D510 Vitamin B12 deficiency anemia due to intrinsic factor deficiency: Secondary | ICD-10-CM | POA: Diagnosis not present

## 2020-09-08 ENCOUNTER — Ambulatory Visit: Payer: PPO | Attending: Internal Medicine

## 2020-09-08 DIAGNOSIS — Z23 Encounter for immunization: Secondary | ICD-10-CM

## 2020-09-08 NOTE — Progress Notes (Signed)
   Covid-19 Vaccination Clinic  Name:  Laura Mcdaniel    MRN: [id number] DOB: 13-Jan-1946  09/08/2020  Laura Mcdaniel was observed post Covid-19 immunization for 15 minutes without incident. She was provided with Vaccine Information Sheet and instruction to access the V-Safe system.   Laura Mcdaniel was instructed to call 911 with any severe reactions post vaccine: Marland Kitchen Difficulty breathing  . Swelling of face and throat  . A fast heartbeat  . A bad rash all over body  . Dizziness and weakness

## 2020-09-24 DIAGNOSIS — E1142 Type 2 diabetes mellitus with diabetic polyneuropathy: Secondary | ICD-10-CM | POA: Diagnosis not present

## 2020-09-24 DIAGNOSIS — L84 Corns and callosities: Secondary | ICD-10-CM | POA: Diagnosis not present

## 2020-09-24 DIAGNOSIS — B351 Tinea unguium: Secondary | ICD-10-CM | POA: Diagnosis not present

## 2020-09-29 DIAGNOSIS — E538 Deficiency of other specified B group vitamins: Secondary | ICD-10-CM | POA: Diagnosis not present

## 2020-09-29 DIAGNOSIS — D510 Vitamin B12 deficiency anemia due to intrinsic factor deficiency: Secondary | ICD-10-CM | POA: Diagnosis not present

## 2020-10-15 DIAGNOSIS — H353212 Exudative age-related macular degeneration, right eye, with inactive choroidal neovascularization: Secondary | ICD-10-CM | POA: Diagnosis not present

## 2020-10-20 ENCOUNTER — Ambulatory Visit: Payer: PPO | Admitting: Dermatology

## 2020-10-27 DIAGNOSIS — E1159 Type 2 diabetes mellitus with other circulatory complications: Secondary | ICD-10-CM | POA: Diagnosis not present

## 2020-10-27 DIAGNOSIS — E1169 Type 2 diabetes mellitus with other specified complication: Secondary | ICD-10-CM | POA: Diagnosis not present

## 2020-10-27 DIAGNOSIS — E669 Obesity, unspecified: Secondary | ICD-10-CM | POA: Diagnosis not present

## 2020-10-27 DIAGNOSIS — E1142 Type 2 diabetes mellitus with diabetic polyneuropathy: Secondary | ICD-10-CM | POA: Diagnosis not present

## 2020-10-27 DIAGNOSIS — E785 Hyperlipidemia, unspecified: Secondary | ICD-10-CM | POA: Diagnosis not present

## 2020-10-27 DIAGNOSIS — I152 Hypertension secondary to endocrine disorders: Secondary | ICD-10-CM | POA: Diagnosis not present

## 2020-10-30 DIAGNOSIS — D510 Vitamin B12 deficiency anemia due to intrinsic factor deficiency: Secondary | ICD-10-CM | POA: Diagnosis not present

## 2020-10-31 DIAGNOSIS — H353212 Exudative age-related macular degeneration, right eye, with inactive choroidal neovascularization: Secondary | ICD-10-CM | POA: Diagnosis not present

## 2020-11-05 DIAGNOSIS — E785 Hyperlipidemia, unspecified: Secondary | ICD-10-CM | POA: Diagnosis not present

## 2020-11-05 DIAGNOSIS — E1169 Type 2 diabetes mellitus with other specified complication: Secondary | ICD-10-CM | POA: Diagnosis not present

## 2020-11-05 DIAGNOSIS — I10 Essential (primary) hypertension: Secondary | ICD-10-CM | POA: Diagnosis not present

## 2020-11-05 DIAGNOSIS — E1165 Type 2 diabetes mellitus with hyperglycemia: Secondary | ICD-10-CM | POA: Diagnosis not present

## 2020-11-07 DIAGNOSIS — Z20822 Contact with and (suspected) exposure to covid-19: Secondary | ICD-10-CM | POA: Diagnosis not present

## 2020-11-10 ENCOUNTER — Other Ambulatory Visit: Payer: Self-pay | Admitting: Physician Assistant

## 2020-11-10 DIAGNOSIS — Z1231 Encounter for screening mammogram for malignant neoplasm of breast: Secondary | ICD-10-CM

## 2020-11-12 DIAGNOSIS — Z794 Long term (current) use of insulin: Secondary | ICD-10-CM | POA: Diagnosis not present

## 2020-11-12 DIAGNOSIS — K449 Diaphragmatic hernia without obstruction or gangrene: Secondary | ICD-10-CM | POA: Diagnosis not present

## 2020-11-12 DIAGNOSIS — I152 Hypertension secondary to endocrine disorders: Secondary | ICD-10-CM | POA: Diagnosis not present

## 2020-11-12 DIAGNOSIS — E538 Deficiency of other specified B group vitamins: Secondary | ICD-10-CM | POA: Diagnosis not present

## 2020-11-12 DIAGNOSIS — N816 Rectocele: Secondary | ICD-10-CM | POA: Diagnosis not present

## 2020-11-12 DIAGNOSIS — Z1231 Encounter for screening mammogram for malignant neoplasm of breast: Secondary | ICD-10-CM | POA: Diagnosis not present

## 2020-11-12 DIAGNOSIS — Z0000 Encounter for general adult medical examination without abnormal findings: Secondary | ICD-10-CM | POA: Diagnosis not present

## 2020-11-12 DIAGNOSIS — E1159 Type 2 diabetes mellitus with other circulatory complications: Secondary | ICD-10-CM | POA: Diagnosis not present

## 2020-11-12 DIAGNOSIS — E1140 Type 2 diabetes mellitus with diabetic neuropathy, unspecified: Secondary | ICD-10-CM | POA: Diagnosis not present

## 2020-11-12 DIAGNOSIS — E1169 Type 2 diabetes mellitus with other specified complication: Secondary | ICD-10-CM | POA: Diagnosis not present

## 2020-11-12 DIAGNOSIS — E1342 Other specified diabetes mellitus with diabetic polyneuropathy: Secondary | ICD-10-CM | POA: Diagnosis not present

## 2020-11-12 DIAGNOSIS — K219 Gastro-esophageal reflux disease without esophagitis: Secondary | ICD-10-CM | POA: Diagnosis not present

## 2020-12-02 DIAGNOSIS — D510 Vitamin B12 deficiency anemia due to intrinsic factor deficiency: Secondary | ICD-10-CM | POA: Diagnosis not present

## 2020-12-02 DIAGNOSIS — E538 Deficiency of other specified B group vitamins: Secondary | ICD-10-CM | POA: Diagnosis not present

## 2020-12-03 DIAGNOSIS — N816 Rectocele: Secondary | ICD-10-CM | POA: Diagnosis not present

## 2020-12-24 DIAGNOSIS — H353122 Nonexudative age-related macular degeneration, left eye, intermediate dry stage: Secondary | ICD-10-CM | POA: Diagnosis not present

## 2020-12-24 DIAGNOSIS — H353211 Exudative age-related macular degeneration, right eye, with active choroidal neovascularization: Secondary | ICD-10-CM | POA: Diagnosis not present

## 2020-12-24 DIAGNOSIS — H35373 Puckering of macula, bilateral: Secondary | ICD-10-CM | POA: Diagnosis not present

## 2020-12-24 DIAGNOSIS — H43813 Vitreous degeneration, bilateral: Secondary | ICD-10-CM | POA: Diagnosis not present

## 2020-12-29 DIAGNOSIS — B351 Tinea unguium: Secondary | ICD-10-CM | POA: Diagnosis not present

## 2020-12-29 DIAGNOSIS — E1142 Type 2 diabetes mellitus with diabetic polyneuropathy: Secondary | ICD-10-CM | POA: Diagnosis not present

## 2020-12-29 DIAGNOSIS — L84 Corns and callosities: Secondary | ICD-10-CM | POA: Diagnosis not present

## 2020-12-30 ENCOUNTER — Ambulatory Visit
Admission: RE | Admit: 2020-12-30 | Discharge: 2020-12-30 | Disposition: A | Discharge status: Home / Self Care | Payer: PPO | Source: Ambulatory Visit | Attending: Physician Assistant | Admitting: Physician Assistant

## 2020-12-30 ENCOUNTER — Other Ambulatory Visit: Payer: Self-pay

## 2020-12-30 DIAGNOSIS — Z1231 Encounter for screening mammogram for malignant neoplasm of breast: Secondary | ICD-10-CM | POA: Diagnosis not present

## 2021-01-05 DIAGNOSIS — D510 Vitamin B12 deficiency anemia due to intrinsic factor deficiency: Secondary | ICD-10-CM | POA: Diagnosis not present

## 2021-01-20 DIAGNOSIS — Z96653 Presence of artificial knee joint, bilateral: Secondary | ICD-10-CM | POA: Diagnosis not present

## 2021-01-20 DIAGNOSIS — M25562 Pain in left knee: Secondary | ICD-10-CM | POA: Diagnosis not present

## 2021-01-20 DIAGNOSIS — M25561 Pain in right knee: Secondary | ICD-10-CM | POA: Diagnosis not present

## 2021-01-27 ENCOUNTER — Other Ambulatory Visit: Payer: Self-pay

## 2021-01-27 ENCOUNTER — Ambulatory Visit: Payer: PPO | Admitting: Dermatology

## 2021-01-27 ENCOUNTER — Encounter: Payer: Self-pay | Admitting: Dermatology

## 2021-01-27 DIAGNOSIS — L304 Erythema intertrigo: Secondary | ICD-10-CM | POA: Diagnosis not present

## 2021-01-27 DIAGNOSIS — L578 Other skin changes due to chronic exposure to nonionizing radiation: Secondary | ICD-10-CM

## 2021-01-27 DIAGNOSIS — L820 Inflamed seborrheic keratosis: Secondary | ICD-10-CM | POA: Diagnosis not present

## 2021-01-27 DIAGNOSIS — L821 Other seborrheic keratosis: Secondary | ICD-10-CM

## 2021-01-27 NOTE — Patient Instructions (Signed)
Wound Care Instructions  1. Cleanse wound gently with soap and water once a day then pat dry with clean gauze. Apply a thing coat of Petrolatum (petroleum jelly, "Vaseline") over the wound (unless you have an allergy to this). We recommend that you use a new, sterile tube of Vaseline. Do not pick or remove scabs. Do not remove the yellow or white "healing tissue" from the base of the wound.  2. Cover the wound with fresh, clean, nonstick gauze and secure with paper tape. You may use Band-Aids in place of gauze and tape if the would is small enough, but would recommend trimming much of the tape off as there is often too much. Sometimes Band-Aids can irritate the skin.  3. You should call the office for your biopsy report after 1 week if you have not already been contacted.  4. If you experience any problems, such as abnormal amounts of bleeding, swelling, significant bruising, significant pain, or evidence of infection, please call the office immediately.  5. FOR ADULT SURGERY PATIENTS: If you need something for pain relief you may take 1 extra strength Tylenol (acetaminophen) AND 2 Ibuprofen (200mg  each) together every 4 hours as needed for pain. (do not take these if you are allergic to them or if you have a reason you should not take them.) Typically, you may only need pain medication for 1 to 3 days.    Recommend Zeasorb AF powder daily

## 2021-01-27 NOTE — Progress Notes (Signed)
   Follow-Up Visit   Subjective  Laura Mcdaniel is a 75 y.o. female who presents for the following: Other (Spots of back that get irritated and itch.).  Her intertrigo is improved with ketoconazole cream.  The following portions of the chart were reviewed this encounter and updated as appropriate:   Tobacco  Allergies  Meds  Problems  Med Hx  Surg Hx  Fam Hx     Review of Systems:  No other skin or systemic complaints except as noted in HPI or Assessment and Plan.  Objective  Well appearing patient in no apparent distress; mood and affect are within normal limits.  A focused examination was performed including back. Relevant physical exam findings are noted in the Assessment and Plan.  Objective  Right mid to low back (22): Erythematous keratotic or waxy stuck-on papule or plaque.    Assessment & Plan    Actinic Damage - chronic, secondary to cumulative UV radiation exposure/sun exposure over time - diffuse scaly erythematous macules with underlying dyspigmentation - Recommend daily broad spectrum sunscreen SPF 30+ to sun-exposed areas, reapply every 2 hours as needed.  - Call for new or changing lesions.  Seborrheic Keratoses - Stuck-on, waxy, tan-brown papules and plaques  - Discussed benign etiology and prognosis. - Observe - Call for any changes  Inflamed seborrheic keratosis (22) Right mid to low back Destruction of lesion - Right mid to low back Complexity: simple   Destruction method: cryotherapy   Informed consent: discussed and consent obtained   Timeout:  patient name, date of birth, surgical site, and procedure verified Lesion destroyed using liquid nitrogen: Yes   Region frozen until ice ball extended beyond lesion: Yes   Outcome: patient tolerated procedure well with no complications   Post-procedure details: wound care instructions given    Erythema intertrigo Groin area Intertrigo is a chronic recurrent rash that occurs in skin fold areas that  may be associated with friction; heat; moisture; yeast; fungus; and bacteria.  It is exacerbated by increased movement / activity; sweating; and higher atmospheric temperature.  Continue Ketoconazole 2% cream qd prn - will likely recur Recommend Zeasorb AF powder daily after resolved for prevention.  Other Related Medications ketoconazole (NIZORAL) 2 % cream  Return if symptoms worsen or fail to improve.   I, Ashok Cordia, CMA, am acting as scribe for Sarina Ser, MD .  Documentation: I have reviewed the above documentation for accuracy and completeness, and I agree with the above.  Sarina Ser, MD

## 2021-02-02 DIAGNOSIS — E1142 Type 2 diabetes mellitus with diabetic polyneuropathy: Secondary | ICD-10-CM | POA: Diagnosis not present

## 2021-02-02 DIAGNOSIS — E1169 Type 2 diabetes mellitus with other specified complication: Secondary | ICD-10-CM | POA: Diagnosis not present

## 2021-02-02 DIAGNOSIS — I152 Hypertension secondary to endocrine disorders: Secondary | ICD-10-CM | POA: Diagnosis not present

## 2021-02-02 DIAGNOSIS — E1159 Type 2 diabetes mellitus with other circulatory complications: Secondary | ICD-10-CM | POA: Diagnosis not present

## 2021-02-02 DIAGNOSIS — Z794 Long term (current) use of insulin: Secondary | ICD-10-CM | POA: Diagnosis not present

## 2021-02-02 DIAGNOSIS — E669 Obesity, unspecified: Secondary | ICD-10-CM | POA: Diagnosis not present

## 2021-02-02 DIAGNOSIS — E1140 Type 2 diabetes mellitus with diabetic neuropathy, unspecified: Secondary | ICD-10-CM | POA: Diagnosis not present

## 2021-02-02 DIAGNOSIS — E785 Hyperlipidemia, unspecified: Secondary | ICD-10-CM | POA: Diagnosis not present

## 2021-02-10 DIAGNOSIS — D510 Vitamin B12 deficiency anemia due to intrinsic factor deficiency: Secondary | ICD-10-CM | POA: Diagnosis not present

## 2021-02-23 DIAGNOSIS — R42 Dizziness and giddiness: Secondary | ICD-10-CM | POA: Diagnosis not present

## 2021-02-23 DIAGNOSIS — E1165 Type 2 diabetes mellitus with hyperglycemia: Secondary | ICD-10-CM | POA: Diagnosis not present

## 2021-02-23 DIAGNOSIS — I152 Hypertension secondary to endocrine disorders: Secondary | ICD-10-CM | POA: Diagnosis not present

## 2021-02-23 DIAGNOSIS — E1159 Type 2 diabetes mellitus with other circulatory complications: Secondary | ICD-10-CM | POA: Diagnosis not present

## 2021-02-24 DIAGNOSIS — H43813 Vitreous degeneration, bilateral: Secondary | ICD-10-CM | POA: Diagnosis not present

## 2021-02-24 DIAGNOSIS — H353211 Exudative age-related macular degeneration, right eye, with active choroidal neovascularization: Secondary | ICD-10-CM | POA: Diagnosis not present

## 2021-02-24 DIAGNOSIS — H35373 Puckering of macula, bilateral: Secondary | ICD-10-CM | POA: Diagnosis not present

## 2021-02-24 DIAGNOSIS — H353122 Nonexudative age-related macular degeneration, left eye, intermediate dry stage: Secondary | ICD-10-CM | POA: Diagnosis not present

## 2021-03-16 DIAGNOSIS — D510 Vitamin B12 deficiency anemia due to intrinsic factor deficiency: Secondary | ICD-10-CM | POA: Diagnosis not present

## 2021-03-30 DIAGNOSIS — L851 Acquired keratosis [keratoderma] palmaris et plantaris: Secondary | ICD-10-CM | POA: Diagnosis not present

## 2021-03-30 DIAGNOSIS — E1142 Type 2 diabetes mellitus with diabetic polyneuropathy: Secondary | ICD-10-CM | POA: Diagnosis not present

## 2021-04-21 DIAGNOSIS — D510 Vitamin B12 deficiency anemia due to intrinsic factor deficiency: Secondary | ICD-10-CM | POA: Diagnosis not present

## 2021-05-06 DIAGNOSIS — E1159 Type 2 diabetes mellitus with other circulatory complications: Secondary | ICD-10-CM | POA: Diagnosis not present

## 2021-05-06 DIAGNOSIS — E785 Hyperlipidemia, unspecified: Secondary | ICD-10-CM | POA: Diagnosis not present

## 2021-05-06 DIAGNOSIS — E1169 Type 2 diabetes mellitus with other specified complication: Secondary | ICD-10-CM | POA: Diagnosis not present

## 2021-05-06 DIAGNOSIS — E1140 Type 2 diabetes mellitus with diabetic neuropathy, unspecified: Secondary | ICD-10-CM | POA: Diagnosis not present

## 2021-05-06 DIAGNOSIS — I152 Hypertension secondary to endocrine disorders: Secondary | ICD-10-CM | POA: Diagnosis not present

## 2021-05-06 DIAGNOSIS — Z794 Long term (current) use of insulin: Secondary | ICD-10-CM | POA: Diagnosis not present

## 2021-05-11 DIAGNOSIS — E785 Hyperlipidemia, unspecified: Secondary | ICD-10-CM | POA: Diagnosis not present

## 2021-05-11 DIAGNOSIS — Z794 Long term (current) use of insulin: Secondary | ICD-10-CM | POA: Diagnosis not present

## 2021-05-11 DIAGNOSIS — E1169 Type 2 diabetes mellitus with other specified complication: Secondary | ICD-10-CM | POA: Diagnosis not present

## 2021-05-11 DIAGNOSIS — I152 Hypertension secondary to endocrine disorders: Secondary | ICD-10-CM | POA: Diagnosis not present

## 2021-05-11 DIAGNOSIS — E1159 Type 2 diabetes mellitus with other circulatory complications: Secondary | ICD-10-CM | POA: Diagnosis not present

## 2021-05-11 DIAGNOSIS — E1140 Type 2 diabetes mellitus with diabetic neuropathy, unspecified: Secondary | ICD-10-CM | POA: Diagnosis not present

## 2021-05-12 DIAGNOSIS — H353122 Nonexudative age-related macular degeneration, left eye, intermediate dry stage: Secondary | ICD-10-CM | POA: Diagnosis not present

## 2021-05-12 DIAGNOSIS — H43813 Vitreous degeneration, bilateral: Secondary | ICD-10-CM | POA: Diagnosis not present

## 2021-05-12 DIAGNOSIS — H353211 Exudative age-related macular degeneration, right eye, with active choroidal neovascularization: Secondary | ICD-10-CM | POA: Diagnosis not present

## 2021-05-12 DIAGNOSIS — H35373 Puckering of macula, bilateral: Secondary | ICD-10-CM | POA: Diagnosis not present

## 2021-05-13 DIAGNOSIS — I152 Hypertension secondary to endocrine disorders: Secondary | ICD-10-CM | POA: Diagnosis not present

## 2021-05-13 DIAGNOSIS — E1165 Type 2 diabetes mellitus with hyperglycemia: Secondary | ICD-10-CM | POA: Diagnosis not present

## 2021-05-13 DIAGNOSIS — M1990 Unspecified osteoarthritis, unspecified site: Secondary | ICD-10-CM | POA: Diagnosis not present

## 2021-05-13 DIAGNOSIS — E1169 Type 2 diabetes mellitus with other specified complication: Secondary | ICD-10-CM | POA: Diagnosis not present

## 2021-05-13 DIAGNOSIS — E1342 Other specified diabetes mellitus with diabetic polyneuropathy: Secondary | ICD-10-CM | POA: Diagnosis not present

## 2021-05-13 DIAGNOSIS — Z0000 Encounter for general adult medical examination without abnormal findings: Secondary | ICD-10-CM | POA: Diagnosis not present

## 2021-05-13 DIAGNOSIS — E785 Hyperlipidemia, unspecified: Secondary | ICD-10-CM | POA: Diagnosis not present

## 2021-05-13 DIAGNOSIS — E1159 Type 2 diabetes mellitus with other circulatory complications: Secondary | ICD-10-CM | POA: Diagnosis not present

## 2021-05-13 DIAGNOSIS — E669 Obesity, unspecified: Secondary | ICD-10-CM | POA: Diagnosis not present

## 2021-05-13 DIAGNOSIS — K219 Gastro-esophageal reflux disease without esophagitis: Secondary | ICD-10-CM | POA: Diagnosis not present

## 2021-05-26 DIAGNOSIS — D510 Vitamin B12 deficiency anemia due to intrinsic factor deficiency: Secondary | ICD-10-CM | POA: Diagnosis not present

## 2021-06-22 DIAGNOSIS — E113393 Type 2 diabetes mellitus with moderate nonproliferative diabetic retinopathy without macular edema, bilateral: Secondary | ICD-10-CM | POA: Diagnosis not present

## 2021-06-30 DIAGNOSIS — D510 Vitamin B12 deficiency anemia due to intrinsic factor deficiency: Secondary | ICD-10-CM | POA: Diagnosis not present

## 2021-07-01 DIAGNOSIS — L851 Acquired keratosis [keratoderma] palmaris et plantaris: Secondary | ICD-10-CM | POA: Diagnosis not present

## 2021-07-01 DIAGNOSIS — B351 Tinea unguium: Secondary | ICD-10-CM | POA: Diagnosis not present

## 2021-07-01 DIAGNOSIS — E1142 Type 2 diabetes mellitus with diabetic polyneuropathy: Secondary | ICD-10-CM | POA: Diagnosis not present

## 2021-08-04 DIAGNOSIS — D510 Vitamin B12 deficiency anemia due to intrinsic factor deficiency: Secondary | ICD-10-CM | POA: Diagnosis not present

## 2021-08-05 DIAGNOSIS — H43813 Vitreous degeneration, bilateral: Secondary | ICD-10-CM | POA: Diagnosis not present

## 2021-08-05 DIAGNOSIS — H353122 Nonexudative age-related macular degeneration, left eye, intermediate dry stage: Secondary | ICD-10-CM | POA: Diagnosis not present

## 2021-08-05 DIAGNOSIS — H353211 Exudative age-related macular degeneration, right eye, with active choroidal neovascularization: Secondary | ICD-10-CM | POA: Diagnosis not present

## 2021-08-05 DIAGNOSIS — H35373 Puckering of macula, bilateral: Secondary | ICD-10-CM | POA: Diagnosis not present

## 2021-08-17 DIAGNOSIS — Z23 Encounter for immunization: Secondary | ICD-10-CM | POA: Diagnosis not present

## 2021-08-19 DIAGNOSIS — E1140 Type 2 diabetes mellitus with diabetic neuropathy, unspecified: Secondary | ICD-10-CM | POA: Diagnosis not present

## 2021-08-19 DIAGNOSIS — Z794 Long term (current) use of insulin: Secondary | ICD-10-CM | POA: Diagnosis not present

## 2021-09-07 DIAGNOSIS — D510 Vitamin B12 deficiency anemia due to intrinsic factor deficiency: Secondary | ICD-10-CM | POA: Diagnosis not present

## 2021-10-05 DIAGNOSIS — B351 Tinea unguium: Secondary | ICD-10-CM | POA: Diagnosis not present

## 2021-10-05 DIAGNOSIS — E1142 Type 2 diabetes mellitus with diabetic polyneuropathy: Secondary | ICD-10-CM | POA: Diagnosis not present

## 2021-10-12 DIAGNOSIS — D510 Vitamin B12 deficiency anemia due to intrinsic factor deficiency: Secondary | ICD-10-CM | POA: Diagnosis not present

## 2021-11-10 DIAGNOSIS — E1169 Type 2 diabetes mellitus with other specified complication: Secondary | ICD-10-CM | POA: Diagnosis not present

## 2021-11-10 DIAGNOSIS — E1159 Type 2 diabetes mellitus with other circulatory complications: Secondary | ICD-10-CM | POA: Diagnosis not present

## 2021-11-10 DIAGNOSIS — I152 Hypertension secondary to endocrine disorders: Secondary | ICD-10-CM | POA: Diagnosis not present

## 2021-11-10 DIAGNOSIS — E785 Hyperlipidemia, unspecified: Secondary | ICD-10-CM | POA: Diagnosis not present

## 2021-11-10 DIAGNOSIS — E1165 Type 2 diabetes mellitus with hyperglycemia: Secondary | ICD-10-CM | POA: Diagnosis not present

## 2021-11-18 ENCOUNTER — Other Ambulatory Visit: Payer: Self-pay | Admitting: Physician Assistant

## 2021-11-18 DIAGNOSIS — Z780 Asymptomatic menopausal state: Secondary | ICD-10-CM | POA: Diagnosis not present

## 2021-11-18 DIAGNOSIS — E785 Hyperlipidemia, unspecified: Secondary | ICD-10-CM | POA: Diagnosis not present

## 2021-11-18 DIAGNOSIS — I152 Hypertension secondary to endocrine disorders: Secondary | ICD-10-CM | POA: Diagnosis not present

## 2021-11-18 DIAGNOSIS — E1165 Type 2 diabetes mellitus with hyperglycemia: Secondary | ICD-10-CM | POA: Diagnosis not present

## 2021-11-18 DIAGNOSIS — Z1231 Encounter for screening mammogram for malignant neoplasm of breast: Secondary | ICD-10-CM

## 2021-11-18 DIAGNOSIS — E1159 Type 2 diabetes mellitus with other circulatory complications: Secondary | ICD-10-CM | POA: Diagnosis not present

## 2021-11-18 DIAGNOSIS — D510 Vitamin B12 deficiency anemia due to intrinsic factor deficiency: Secondary | ICD-10-CM | POA: Diagnosis not present

## 2021-11-18 DIAGNOSIS — Z0000 Encounter for general adult medical examination without abnormal findings: Secondary | ICD-10-CM | POA: Diagnosis not present

## 2021-11-18 DIAGNOSIS — E1169 Type 2 diabetes mellitus with other specified complication: Secondary | ICD-10-CM | POA: Diagnosis not present

## 2021-11-18 DIAGNOSIS — E669 Obesity, unspecified: Secondary | ICD-10-CM | POA: Diagnosis not present

## 2021-11-18 DIAGNOSIS — E1342 Other specified diabetes mellitus with diabetic polyneuropathy: Secondary | ICD-10-CM | POA: Diagnosis not present

## 2021-12-09 DIAGNOSIS — H43813 Vitreous degeneration, bilateral: Secondary | ICD-10-CM | POA: Diagnosis not present

## 2021-12-09 DIAGNOSIS — H35373 Puckering of macula, bilateral: Secondary | ICD-10-CM | POA: Diagnosis not present

## 2021-12-09 DIAGNOSIS — H353212 Exudative age-related macular degeneration, right eye, with inactive choroidal neovascularization: Secondary | ICD-10-CM | POA: Diagnosis not present

## 2021-12-09 DIAGNOSIS — H353122 Nonexudative age-related macular degeneration, left eye, intermediate dry stage: Secondary | ICD-10-CM | POA: Diagnosis not present

## 2021-12-14 ENCOUNTER — Ambulatory Visit: Payer: PPO | Admitting: Dermatology

## 2021-12-14 ENCOUNTER — Other Ambulatory Visit: Payer: Self-pay

## 2021-12-14 ENCOUNTER — Encounter: Payer: Self-pay | Admitting: Dermatology

## 2021-12-14 DIAGNOSIS — L821 Other seborrheic keratosis: Secondary | ICD-10-CM | POA: Diagnosis not present

## 2021-12-14 DIAGNOSIS — D229 Melanocytic nevi, unspecified: Secondary | ICD-10-CM

## 2021-12-14 DIAGNOSIS — L820 Inflamed seborrheic keratosis: Secondary | ICD-10-CM

## 2021-12-14 DIAGNOSIS — L578 Other skin changes due to chronic exposure to nonionizing radiation: Secondary | ICD-10-CM

## 2021-12-14 DIAGNOSIS — D2239 Melanocytic nevi of other parts of face: Secondary | ICD-10-CM | POA: Diagnosis not present

## 2021-12-14 NOTE — Progress Notes (Signed)
° °  Follow-Up Visit   Subjective  Laura Mcdaniel is a 76 y.o. female who presents for the following: Skin Problem (Check sore areas on the scalp that will never go away ). The patient has spots, moles and lesions to be evaluated, some may be new or changing and the patient has concerns that these could be cancer.   The following portions of the chart were reviewed this encounter and updated as appropriate:   Tobacco   Allergies   Meds   Problems   Med Hx   Surg Hx   Fam Hx      Review of Systems:  No other skin or systemic complaints except as noted in HPI or Assessment and Plan.  Objective  Well appearing patient in no apparent distress; mood and affect are within normal limits.  A focused examination was performed including face,scalp . Relevant physical exam findings are noted in the Assessment and Plan.  right vertex scalp x 1, left vertex scalp x 1, low back x 1 (3) (3) Stuck-on, waxy, tan-brown papules   right anterior mandible near the chin Tan-brown and/or pink-flesh-colored symmetric macules and papules.    Assessment & Plan  Inflamed seborrheic keratosis (3) right vertex scalp x 1, left vertex scalp x 1, low back x 1 (3) Reassured benign age-related growth.  Recommend observation.  Discussed cryotherapy if spot(s) become irritated or inflamed.   Prior to procedure, discussed risks of blister formation, small wound, skin dyspigmentation, or rare scar following cryotherapy. Recommend Vaseline ointment to treated areas while healing.    Destruction of lesion - right vertex scalp x 1, left vertex scalp x 1, low back x 1 (3) Complexity: simple   Destruction method: cryotherapy   Informed consent: discussed and consent obtained   Timeout:  patient name, date of birth, surgical site, and procedure verified Lesion destroyed using liquid nitrogen: Yes   Region frozen until ice ball extended beyond lesion: Yes   Outcome: patient tolerated procedure well with no complications    Post-procedure details: wound care instructions given    Nevus right anterior mandible near the chin Benign-appearing.  Observation.  Call clinic for new or changing lesions.  Recommend daily use of broad spectrum spf 30+ sunscreen to sun-exposed areas.  The patient will observe these symptoms, and report promptly any worsening or unexpected persistence.  If well, may return prn.   Actinic Damage - chronic, secondary to cumulative UV radiation exposure/sun exposure over time - diffuse scaly erythematous macules with underlying dyspigmentation - Recommend daily broad spectrum sunscreen SPF 30+ to sun-exposed areas, reapply every 2 hours as needed.  - Recommend staying in the shade or wearing long sleeves, sun glasses (UVA+UVB protection) and wide brim hats (4-inch brim around the entire circumference of the hat). - Call for new or changing lesions.   Seborrheic Keratoses - Stuck-on, waxy, tan-brown papules and/or plaques  - Benign-appearing - Discussed benign etiology and prognosis. - Observe - Call for any changes  Return if symptoms worsen or fail to improve.  IMarye Round, CMA, am acting as scribe for Sarina Ser, MD .  Documentation: I have reviewed the above documentation for accuracy and completeness, and I agree with the above.  Sarina Ser, MD

## 2021-12-14 NOTE — Patient Instructions (Addendum)

## 2021-12-15 DIAGNOSIS — Z794 Long term (current) use of insulin: Secondary | ICD-10-CM | POA: Diagnosis not present

## 2021-12-15 DIAGNOSIS — E1140 Type 2 diabetes mellitus with diabetic neuropathy, unspecified: Secondary | ICD-10-CM | POA: Diagnosis not present

## 2021-12-22 DIAGNOSIS — E1169 Type 2 diabetes mellitus with other specified complication: Secondary | ICD-10-CM | POA: Diagnosis not present

## 2021-12-22 DIAGNOSIS — E1142 Type 2 diabetes mellitus with diabetic polyneuropathy: Secondary | ICD-10-CM | POA: Diagnosis not present

## 2021-12-22 DIAGNOSIS — D510 Vitamin B12 deficiency anemia due to intrinsic factor deficiency: Secondary | ICD-10-CM | POA: Diagnosis not present

## 2021-12-22 DIAGNOSIS — E785 Hyperlipidemia, unspecified: Secondary | ICD-10-CM | POA: Diagnosis not present

## 2021-12-22 DIAGNOSIS — I152 Hypertension secondary to endocrine disorders: Secondary | ICD-10-CM | POA: Diagnosis not present

## 2021-12-22 DIAGNOSIS — E1140 Type 2 diabetes mellitus with diabetic neuropathy, unspecified: Secondary | ICD-10-CM | POA: Diagnosis not present

## 2021-12-22 DIAGNOSIS — Z794 Long term (current) use of insulin: Secondary | ICD-10-CM | POA: Diagnosis not present

## 2021-12-22 DIAGNOSIS — E1159 Type 2 diabetes mellitus with other circulatory complications: Secondary | ICD-10-CM | POA: Diagnosis not present

## 2021-12-31 ENCOUNTER — Other Ambulatory Visit: Payer: Self-pay

## 2021-12-31 ENCOUNTER — Ambulatory Visit
Admission: RE | Admit: 2021-12-31 | Discharge: 2021-12-31 | Disposition: A | Discharge status: Home / Self Care | Payer: PPO | Source: Ambulatory Visit | Attending: Physician Assistant | Admitting: Physician Assistant

## 2021-12-31 DIAGNOSIS — Z1231 Encounter for screening mammogram for malignant neoplasm of breast: Secondary | ICD-10-CM | POA: Diagnosis not present

## 2022-01-04 DIAGNOSIS — B351 Tinea unguium: Secondary | ICD-10-CM | POA: Diagnosis not present

## 2022-01-04 DIAGNOSIS — E1142 Type 2 diabetes mellitus with diabetic polyneuropathy: Secondary | ICD-10-CM | POA: Diagnosis not present

## 2022-01-04 DIAGNOSIS — L851 Acquired keratosis [keratoderma] palmaris et plantaris: Secondary | ICD-10-CM | POA: Diagnosis not present

## 2022-01-12 DIAGNOSIS — Z96653 Presence of artificial knee joint, bilateral: Secondary | ICD-10-CM | POA: Diagnosis not present

## 2022-01-21 DIAGNOSIS — M810 Age-related osteoporosis without current pathological fracture: Secondary | ICD-10-CM | POA: Diagnosis not present

## 2022-01-25 DIAGNOSIS — D510 Vitamin B12 deficiency anemia due to intrinsic factor deficiency: Secondary | ICD-10-CM | POA: Diagnosis not present

## 2022-02-25 DIAGNOSIS — D510 Vitamin B12 deficiency anemia due to intrinsic factor deficiency: Secondary | ICD-10-CM | POA: Diagnosis not present

## 2022-03-10 DIAGNOSIS — H353212 Exudative age-related macular degeneration, right eye, with inactive choroidal neovascularization: Secondary | ICD-10-CM | POA: Diagnosis not present

## 2022-03-22 DIAGNOSIS — R55 Syncope and collapse: Secondary | ICD-10-CM | POA: Diagnosis not present

## 2022-03-22 DIAGNOSIS — R42 Dizziness and giddiness: Secondary | ICD-10-CM | POA: Diagnosis not present

## 2022-03-22 DIAGNOSIS — R399 Unspecified symptoms and signs involving the genitourinary system: Secondary | ICD-10-CM | POA: Diagnosis not present

## 2022-03-22 DIAGNOSIS — R002 Palpitations: Secondary | ICD-10-CM | POA: Diagnosis not present

## 2022-03-23 DIAGNOSIS — R55 Syncope and collapse: Secondary | ICD-10-CM | POA: Diagnosis not present

## 2022-03-23 DIAGNOSIS — R399 Unspecified symptoms and signs involving the genitourinary system: Secondary | ICD-10-CM | POA: Diagnosis not present

## 2022-03-23 DIAGNOSIS — R002 Palpitations: Secondary | ICD-10-CM | POA: Diagnosis not present

## 2022-03-23 DIAGNOSIS — R42 Dizziness and giddiness: Secondary | ICD-10-CM | POA: Diagnosis not present

## 2022-03-30 DIAGNOSIS — D510 Vitamin B12 deficiency anemia due to intrinsic factor deficiency: Secondary | ICD-10-CM | POA: Diagnosis not present

## 2022-04-12 DIAGNOSIS — B351 Tinea unguium: Secondary | ICD-10-CM | POA: Diagnosis not present

## 2022-04-12 DIAGNOSIS — M79675 Pain in left toe(s): Secondary | ICD-10-CM | POA: Diagnosis not present

## 2022-04-12 DIAGNOSIS — M79674 Pain in right toe(s): Secondary | ICD-10-CM | POA: Diagnosis not present

## 2022-04-13 DIAGNOSIS — E538 Deficiency of other specified B group vitamins: Secondary | ICD-10-CM | POA: Diagnosis not present

## 2022-04-13 DIAGNOSIS — I152 Hypertension secondary to endocrine disorders: Secondary | ICD-10-CM | POA: Diagnosis not present

## 2022-04-13 DIAGNOSIS — E669 Obesity, unspecified: Secondary | ICD-10-CM | POA: Diagnosis not present

## 2022-04-13 DIAGNOSIS — E785 Hyperlipidemia, unspecified: Secondary | ICD-10-CM | POA: Diagnosis not present

## 2022-04-13 DIAGNOSIS — E1169 Type 2 diabetes mellitus with other specified complication: Secondary | ICD-10-CM | POA: Diagnosis not present

## 2022-04-13 DIAGNOSIS — D649 Anemia, unspecified: Secondary | ICD-10-CM | POA: Diagnosis not present

## 2022-04-13 DIAGNOSIS — Z0000 Encounter for general adult medical examination without abnormal findings: Secondary | ICD-10-CM | POA: Diagnosis not present

## 2022-04-13 DIAGNOSIS — E1342 Other specified diabetes mellitus with diabetic polyneuropathy: Secondary | ICD-10-CM | POA: Diagnosis not present

## 2022-04-13 DIAGNOSIS — E1159 Type 2 diabetes mellitus with other circulatory complications: Secondary | ICD-10-CM | POA: Diagnosis not present

## 2022-04-13 DIAGNOSIS — E1165 Type 2 diabetes mellitus with hyperglycemia: Secondary | ICD-10-CM | POA: Diagnosis not present

## 2022-04-15 DIAGNOSIS — R002 Palpitations: Secondary | ICD-10-CM | POA: Diagnosis not present

## 2022-04-15 DIAGNOSIS — R55 Syncope and collapse: Secondary | ICD-10-CM | POA: Diagnosis not present

## 2022-04-28 DIAGNOSIS — D649 Anemia, unspecified: Secondary | ICD-10-CM | POA: Diagnosis not present

## 2022-04-28 DIAGNOSIS — R790 Abnormal level of blood mineral: Secondary | ICD-10-CM | POA: Diagnosis not present

## 2022-05-02 DIAGNOSIS — R55 Syncope and collapse: Secondary | ICD-10-CM | POA: Diagnosis not present

## 2022-05-04 DIAGNOSIS — D510 Vitamin B12 deficiency anemia due to intrinsic factor deficiency: Secondary | ICD-10-CM | POA: Diagnosis not present

## 2022-05-13 ENCOUNTER — Other Ambulatory Visit: Payer: Self-pay

## 2022-05-13 ENCOUNTER — Observation Stay
Admission: EM | Admit: 2022-05-13 | Discharge: 2022-05-14 | Disposition: A | Discharge status: Home / Self Care | Payer: PPO | Attending: Internal Medicine | Admitting: Internal Medicine

## 2022-05-13 ENCOUNTER — Emergency Department: Payer: PPO

## 2022-05-13 ENCOUNTER — Observation Stay: Payer: PPO

## 2022-05-13 ENCOUNTER — Encounter: Payer: Self-pay | Admitting: Internal Medicine

## 2022-05-13 DIAGNOSIS — R079 Chest pain, unspecified: Secondary | ICD-10-CM | POA: Diagnosis not present

## 2022-05-13 DIAGNOSIS — R0789 Other chest pain: Secondary | ICD-10-CM | POA: Diagnosis present

## 2022-05-13 DIAGNOSIS — E1151 Type 2 diabetes mellitus with diabetic peripheral angiopathy without gangrene: Secondary | ICD-10-CM | POA: Diagnosis not present

## 2022-05-13 DIAGNOSIS — E538 Deficiency of other specified B group vitamins: Secondary | ICD-10-CM | POA: Diagnosis present

## 2022-05-13 DIAGNOSIS — R0600 Dyspnea, unspecified: Secondary | ICD-10-CM | POA: Diagnosis not present

## 2022-05-13 DIAGNOSIS — R0602 Shortness of breath: Secondary | ICD-10-CM | POA: Diagnosis not present

## 2022-05-13 DIAGNOSIS — Z7982 Long term (current) use of aspirin: Secondary | ICD-10-CM | POA: Insufficient documentation

## 2022-05-13 DIAGNOSIS — I10 Essential (primary) hypertension: Secondary | ICD-10-CM | POA: Diagnosis not present

## 2022-05-13 DIAGNOSIS — Z7984 Long term (current) use of oral hypoglycemic drugs: Secondary | ICD-10-CM | POA: Insufficient documentation

## 2022-05-13 DIAGNOSIS — I639 Cerebral infarction, unspecified: Secondary | ICD-10-CM | POA: Diagnosis not present

## 2022-05-13 DIAGNOSIS — E785 Hyperlipidemia, unspecified: Secondary | ICD-10-CM | POA: Diagnosis present

## 2022-05-13 DIAGNOSIS — Z96653 Presence of artificial knee joint, bilateral: Secondary | ICD-10-CM | POA: Diagnosis not present

## 2022-05-13 DIAGNOSIS — K219 Gastro-esophageal reflux disease without esophagitis: Secondary | ICD-10-CM | POA: Diagnosis present

## 2022-05-13 DIAGNOSIS — Z79899 Other long term (current) drug therapy: Secondary | ICD-10-CM | POA: Insufficient documentation

## 2022-05-13 DIAGNOSIS — R55 Syncope and collapse: Secondary | ICD-10-CM | POA: Diagnosis not present

## 2022-05-13 DIAGNOSIS — E1165 Type 2 diabetes mellitus with hyperglycemia: Secondary | ICD-10-CM | POA: Diagnosis not present

## 2022-05-13 DIAGNOSIS — G629 Polyneuropathy, unspecified: Secondary | ICD-10-CM

## 2022-05-13 HISTORY — DX: Mixed hyperlipidemia: E78.2

## 2022-05-13 HISTORY — DX: Syncope and collapse: R55

## 2022-05-13 HISTORY — DX: Endocarditis, valve unspecified: I38

## 2022-05-13 HISTORY — DX: Type 2 diabetes mellitus without complications: E11.9

## 2022-05-13 LAB — CBC
HCT: 32.5 % — ABNORMAL LOW (ref 36.0–46.0)
Hemoglobin: 10.3 g/dL — ABNORMAL LOW (ref 12.0–15.0)
MCH: 26.4 pg (ref 26.0–34.0)
MCHC: 31.7 g/dL (ref 30.0–36.0)
MCV: 83.3 fL (ref 80.0–100.0)
Platelets: 334 10*3/uL (ref 150–400)
RBC: 3.9 MIL/uL (ref 3.87–5.11)
RDW: 15.1 % (ref 11.5–15.5)
WBC: 6.8 10*3/uL (ref 4.0–10.5)
nRBC: 0 % (ref 0.0–0.2)

## 2022-05-13 LAB — URINALYSIS, ROUTINE W REFLEX MICROSCOPIC
Bacteria, UA: NONE SEEN
Bilirubin Urine: NEGATIVE
Glucose, UA: NEGATIVE mg/dL
Hgb urine dipstick: NEGATIVE
Ketones, ur: NEGATIVE mg/dL
Nitrite: NEGATIVE
Protein, ur: NEGATIVE mg/dL
Specific Gravity, Urine: 1.024 (ref 1.005–1.030)
pH: 5 (ref 5.0–8.0)

## 2022-05-13 LAB — BASIC METABOLIC PANEL
Anion gap: 8 (ref 5–15)
BUN: 23 mg/dL (ref 8–23)
CO2: 24 mmol/L (ref 22–32)
Calcium: 9.5 mg/dL (ref 8.9–10.3)
Chloride: 101 mmol/L (ref 98–111)
Creatinine, Ser: 0.75 mg/dL (ref 0.44–1.00)
GFR, Estimated: >60 mL/min (ref >60)
Glucose, Bld: 158 mg/dL — ABNORMAL HIGH (ref 70–99)
Potassium: 4.1 mmol/L (ref 3.5–5.1)
Sodium: 133 mmol/L — ABNORMAL LOW (ref 135–145)

## 2022-05-13 LAB — TROPONIN I (HIGH SENSITIVITY)
Troponin I (High Sensitivity): 5 ng/L (ref <18)
Troponin I (High Sensitivity): 5 ng/L (ref <18)

## 2022-05-13 LAB — BRAIN NATRIURETIC PEPTIDE: B Natriuretic Peptide: 51.3 pg/mL (ref 0.0–100.0)

## 2022-05-13 LAB — MAGNESIUM: Magnesium: 1.3 mg/dL — ABNORMAL LOW (ref 1.7–2.4)

## 2022-05-13 LAB — CBG MONITORING, ED: Glucose-Capillary: 147 mg/dL — ABNORMAL HIGH (ref 70–99)

## 2022-05-13 LAB — TSH: TSH: 3.209 u[IU]/mL (ref 0.350–4.500)

## 2022-05-13 MED ORDER — INSULIN ASPART 100 UNIT/ML IJ SOLN
0.0000 [IU] | Freq: Three times a day (TID) | INTRAMUSCULAR | Status: DC
  Administered 2022-05-14: 2 [IU] via SUBCUTANEOUS
  Administered 2022-05-14: 3 [IU] via SUBCUTANEOUS
  Filled 2022-05-13 (×2): qty 1

## 2022-05-13 MED ORDER — ATORVASTATIN CALCIUM 20 MG PO TABS
40.0000 mg | ORAL_TABLET | Freq: Every day | ORAL | Status: DC
  Administered 2022-05-13: 40 mg via ORAL
  Filled 2022-05-13: qty 2

## 2022-05-13 MED ORDER — ONDANSETRON HCL 4 MG/2ML IJ SOLN: 4.0000 mg | Freq: Four times a day (QID) | INTRAMUSCULAR | Status: DC | PRN

## 2022-05-13 MED ORDER — FLUTICASONE PROPIONATE 50 MCG/ACT NA SUSP: 1.0000 | Freq: Every day | NASAL | Status: DC | PRN

## 2022-05-13 MED ORDER — ADULT MULTIVITAMIN W/MINERALS CH
1.0000 | ORAL_TABLET | Freq: Every day | ORAL | Status: DC
  Administered 2022-05-14: 1 via ORAL
  Filled 2022-05-13: qty 1

## 2022-05-13 MED ORDER — ENOXAPARIN SODIUM 40 MG/0.4ML IJ SOSY: 40.0000 mg | PREFILLED_SYRINGE | Freq: Every day | INTRAMUSCULAR | Status: DC

## 2022-05-13 MED ORDER — ASCORBIC ACID 500 MG PO TABS
500.0000 mg | ORAL_TABLET | Freq: Every day | ORAL | Status: DC
  Administered 2022-05-14: 500 mg via ORAL
  Filled 2022-05-13: qty 1

## 2022-05-13 MED ORDER — FERROUS SULFATE 325 (65 FE) MG PO TABS: 325.0000 mg | ORAL_TABLET | Freq: Every day | ORAL | Status: DC

## 2022-05-13 MED ORDER — LISINOPRIL 10 MG PO TABS
10.0000 mg | ORAL_TABLET | Freq: Every day | ORAL | Status: DC
  Administered 2022-05-14: 10 mg via ORAL
  Filled 2022-05-13: qty 1

## 2022-05-13 MED ORDER — METFORMIN HCL 500 MG PO TABS: 1000.0000 mg | ORAL_TABLET | Freq: Two times a day (BID) | ORAL | Status: DC

## 2022-05-13 MED ORDER — MAGNESIUM SULFATE 2 GM/50ML IV SOLN
2.0000 g | Freq: Once | INTRAVENOUS | Status: AC
  Administered 2022-05-13: 2 g via INTRAVENOUS
  Filled 2022-05-13: qty 50

## 2022-05-13 MED ORDER — HYDRALAZINE HCL 10 MG PO TABS: 10.0000 mg | ORAL_TABLET | Freq: Four times a day (QID) | ORAL | Status: DC | PRN

## 2022-05-13 MED ORDER — IOHEXOL 350 MG/ML SOLN
75.0000 mL | Freq: Once | INTRAVENOUS | Status: AC | PRN
  Administered 2022-05-13: 75 mL via INTRAVENOUS

## 2022-05-13 MED ORDER — ACETAMINOPHEN 325 MG PO TABS
650.0000 mg | ORAL_TABLET | ORAL | Status: DC | PRN
  Administered 2022-05-14: 650 mg via ORAL
  Filled 2022-05-13: qty 2

## 2022-05-13 MED ORDER — HYDROCHLOROTHIAZIDE 25 MG PO TABS
25.0000 mg | ORAL_TABLET | Freq: Every day | ORAL | Status: DC
  Administered 2022-05-14: 25 mg via ORAL
  Filled 2022-05-13: qty 1

## 2022-05-13 MED ORDER — PANTOPRAZOLE SODIUM 40 MG PO TBEC
40.0000 mg | DELAYED_RELEASE_TABLET | Freq: Every day | ORAL | Status: DC
  Administered 2022-05-14: 40 mg via ORAL
  Filled 2022-05-13: qty 1

## 2022-05-13 MED ORDER — GABAPENTIN 300 MG PO CAPS
300.0000 mg | ORAL_CAPSULE | Freq: Two times a day (BID) | ORAL | Status: DC
  Administered 2022-05-13 – 2022-05-14 (×2): 300 mg via ORAL
  Filled 2022-05-13 (×2): qty 1

## 2022-05-13 MED ORDER — ASPIRIN 81 MG PO TBEC
81.0000 mg | DELAYED_RELEASE_TABLET | Freq: Every day | ORAL | Status: DC
Start: 2022-05-13 — End: 2022-05-14
  Administered 2022-05-13: 81 mg via ORAL
  Filled 2022-05-13: qty 1

## 2022-05-13 NOTE — Assessment & Plan Note (Signed)
-   Gabapentin 300 mg p.o. twice daily has been resumed

## 2022-05-13 NOTE — Assessment & Plan Note (Signed)
-   With shortness of breath - Both are worse with exertion - Cardiology, [facility], Dr. Haroldine Laws has been consulted via page for consideration of inpatient stress test - Second troponin has been ordered - BNP has been ordered and pending collection - Message nursing staff to collect second high sensitive troponin and BNP - If BNP is elevated, I would recommend ordering a complete echo - Admit to telemetry cardiac, observation

## 2022-05-13 NOTE — Assessment & Plan Note (Signed)
PPI ?

## 2022-05-13 NOTE — Assessment & Plan Note (Addendum)
-   Patient takes hydrochlorothiazide 25 mg daily, lisinopril 10 mg daily, these have been resumed for 05/14/2022 - Hydralazine 10 mg p.o. every 6 hours as needed for SBP greater than 175, 4 days ordered

## 2022-05-13 NOTE — Assessment & Plan Note (Signed)
-   Patient states she receives B12 injection outpatient - We will check B12 lab levels in the a.m.

## 2022-05-13 NOTE — ED Notes (Signed)
Pt assisted to restroom. Pt doing "wall walk" which she says she does at home when needed. Pt does not use ambulation devices. Pt assisted with getting into night wear. Pt did not want to wear gown. Call light, phone, and ipad within reach. Pt watching TV at this time.

## 2022-05-13 NOTE — ED Triage Notes (Signed)
Pt here with dizziness and near syncope. Pt states she had 2 episodes yesterday where she felt as though she was going to pass out but then recovered. Pt states she was told to come to the ED to be evaluated.

## 2022-05-13 NOTE — Hospital Course (Signed)
Ms. Laura Mcdaniel is a 76 year old female with history of GERD, hypertension, hyperlipidemia, neuropathy, non-insulin-dependent diabetes mellitus, who presents emergency department for chief concerns of near syncope, and chest pain with dyspnea on exertion.  Initial vitals in the emergency department showed temperature of 98.5, respiration rate of 15, heart rate 71, blood pressure 146/64, SPO2 100% on room air.  Serum sodium 133, potassium 4.1, chloride 101, bicarb 24, BUN of 23, serum creatinine of 0.75, GFR greater than 60, nonfasting blood glucose 158, WBC 6.8, hemoglobin 10.3, platelets of 334.  ED treatment: Magnesium 2 g IV

## 2022-05-13 NOTE — ED Notes (Signed)
Pt back from MRI and is restroom. Pt to be hooked back up. Pt's provider contacted about nightly meds, waiting on a response

## 2022-05-13 NOTE — H&P (Addendum)
History and Physical   Laura Mcdaniel DDU:[id number] DOB: [birth year] DOA: 05/13/2022  PCP: Marinda Elk, MD  Patient coming from: home  I have personally briefly reviewed patient's old medical records in [place].  Chief Concern: Near syncope and dizziness  HPI: Ms. Laura Mcdaniel is a 76 year old female with history of GERD, hypertension, hyperlipidemia, neuropathy, non-insulin-dependent diabetes mellitus, who presents emergency department for chief concerns of near syncope, and chest pain with dyspnea on exertion.  Initial vitals in the emergency department showed temperature of 98.5, respiration rate of 15, heart rate 71, blood pressure 146/64, SPO2 100% on room air.  Serum sodium 133, potassium 4.1, chloride 101, bicarb 24, BUN of 23, serum creatinine of 0.75, GFR greater than 60, nonfasting blood glucose 158, WBC 6.8, hemoglobin 10.3, platelets of 334.  ED treatment: Magnesium 2 g IV  At bedside patient is awake alert and oriented to self, age, current location, current calendar year.  She reports that over the last few weeks she has been having worsening dizziness with multiple episodes of blacking out though no loss of consciousness.  She has not had any head trauma.  She endorses actual syncope a couple of years ago.  Of note over the last 2 years she has had on and off dyspnea with exertion and chest pain/pressure with exertion with radiation down her left upper extremities.  She further endorses that her lower extremities have pain with ambulation and this has been ongoing for the last 2 years.  She denies any known fever, vomiting, diarrhea, dysuria, hematuria, abdominal pain.  She endorses nausea and swelling of her lower extremities that she attributes to neuropathy.  At bedside she did not have pitting lower extremity edema.  She reports she had a stress test 5 years ago and was told it was negative.  Social history: She lives at home with her husband.  She  denies tobacco, EtOH, recreational drug use.  She is retired and formerly worked in International aid/development worker.  ROS: Constitutional: no weight change, no fever ENT/Mouth: no sore throat, no rhinorrhea Eyes: no eye pain, no vision changes Cardiovascular: + chest pain, + dyspnea,  + edema, no palpitations Respiratory: no cough, no sputum, no wheezing Gastrointestinal: no nausea, no vomiting, no diarrhea, no constipation Genitourinary: no urinary incontinence, no dysuria, no hematuria Musculoskeletal: no arthralgias, no myalgias Skin: no skin lesions, no pruritus, Neuro: + weakness, no loss of consciousness, no syncope, black outs Psych: no anxiety, no depression, + decrease appetite Heme/Lymph: no bruising, no bleeding  ED Course: Discussed with emergency medicine provider, patient required hospitalization for chief concerns of shortness of breath and chest pressure with exertion.  Assessment/Plan  Principal Problem:   Near syncope Active Problems:   Left chest pressure   Hypomagnesemia   Neuropathy   Essential hypertension   B12 deficiency   Hyperlipidemia   GERD (gastroesophageal reflux disease)   Assessment and Plan:  * Near syncope - Etiology work-up in progress - Second troponin and BNP are pending collection - Cardiology has been consulted for consideration of inpatient stress test - Orthostatic vital signs ordered - MRI of the brain without contrast has been ordered - Telemetry cardiac, observation  Left chest pressure - With shortness of breath - Both are worse with exertion - Cardiology, [facility], Dr. Haroldine Laws has been consulted via page for consideration of inpatient stress test - Second troponin has been ordered - BNP has been ordered and pending collection - Message nursing staff to collect second high  sensitive troponin and BNP - If BNP is elevated, I would recommend ordering a complete echo - Admit to telemetry cardiac, observation  GERD (gastroesophageal reflux disease) -  PPI  Hyperlipidemia - Atorvastatin 40 mg nightly resumed  B12 deficiency - Patient states she receives B12 injection outpatient - We will check B12 lab levels in the a.m.  Essential hypertension - Patient takes hydrochlorothiazide 25 mg daily, lisinopril 10 mg daily, these have been resumed for 05/14/2022 - Hydralazine 10 mg p.o. every 6 hours as needed for SBP greater than 175, 4 days ordered  Neuropathy - Gabapentin 300 mg p.o. twice daily has been resumed  Hypomagnesemia - Status post replace with magnesium 2 g IV per EDP - Check magnesium in the a.m.  Med reconciliation-AM team to complete med reconciliation  Chart reviewed.   DVT prophylaxis: Enoxaparin Code Status: Full code Diet: Heart healthy/carb modified Family Communication: Phone call was offered, patient states that her husband already knows she is in the hospital Disposition Plan: Pending clinical course Consults called: Cardiology Admission status: Telemetry cardiac, observation  Past Medical History:  Diagnosis Date   Anemia    B12 deficiency    Carpal tunnel syndrome    Diabetes mellitus without complication ([place])    Diarrhea    Difficult intubation    DJD (degenerative joint disease)    Enlarged RV (right ventricle)    Enlarged thyroid gland 1970's   GERD (gastroesophageal reflux disease)    Hearing loss    Hiatal hernia    Hypertension    IBS (irritable bowel syndrome)    Macular degeneration    Mitral valve regurgitation    Peripheral neuropathy    Stomatitis    Past Surgical History:  Procedure Laterality Date   ABDOMINAL HYSTERECTOMY     APPENDECTOMY     carpel tunnel Bilateral    CATARACT EXTRACTION Bilateral    COLONOSCOPY WITH PROPOFOL N/A 12/23/2017   Procedure: COLONOSCOPY WITH PROPOFOL;  Surgeon: Manya Silvas, MD;  Location: [facility] ENDOSCOPY;  Service: Endoscopy;  Laterality: N/A;   EYE SURGERY     bilateral   HAMMER TOE SURGERY Left 01/06/2018   Procedure: HAMMER TOE  CORRECTION-2ND & 3RD TOES;  Surgeon: Albertine Patricia, DPM;  Location: [facility] ORS;  Service: Podiatry;  Laterality: Left;   JOINT REPLACEMENT     left knee2/9/09 right knee 2017   KNEE ARTHROPLASTY Right 01/19/2016   Procedure: COMPUTER ASSISTED TOTAL KNEE ARTHROPLASTY;  Surgeon: Dereck Leep, MD;  Location: [facility] ORS;  Service: Orthopedics;  Laterality: Right;   left knee surgery x2     Left total knee arthroplasty  2009   Dr. Marry Guan   SHOULDER SURGERY Left    THYROIDECTOMY     TONSILLECTOMY     WEIL OSTEOTOMY Left 01/06/2018   Procedure: WEIL OSTEOTOMY-2ND METATARSAL;  Surgeon: Albertine Patricia, DPM;  Location: [facility] ORS;  Service: Podiatry;  Laterality: Left;   Social History:  reports that she has never smoked. She has never used smokeless tobacco. She reports that she does not drink alcohol and does not use drugs.  Allergies  Allergen Reactions   Mobic [Meloxicam]     Light headed and confusion    Vicodin [Hydrocodone-Acetaminophen] Nausea Only   Family History  Problem Relation Age of Onset   Cancer Mother    Diabetes Father    Breast cancer Neg Hx    Family history: Family history reviewed and not pertinent  Prior to Admission medications   Medication Sig Start Date  End Date Taking? Authorizing Provider  Aflibercept (EYLEA IO) Inject 1 Syringe into the eye as directed. Per ophthamology Office for macular degeneration Patient not taking: Reported on 05/13/2022    [provider]  alendronate (FOSAMAX) 70 MG tablet Take 1 tablet by mouth every 7 (seven) days. 03/29/22   [provider]  aspirin EC 81 MG tablet Take 81 mg by mouth at bedtime.    [provider]  atorvastatin (LIPITOR) 40 MG tablet Take 40 mg by mouth at bedtime.     [provider]  Calcium Carb-Cholecalciferol (CALCIUM 600+D3 PO) Take 1 tablet by mouth 2 (two) times daily.    [provider]  ferrous sulfate 325 (65 FE) MG tablet Take 325 mg by mouth daily.    [provider]  fluconazole (DIFLUCAN) 200 MG tablet Take 1 tablet (200 mg total) by mouth as directed. Take 1 pill Wednesday, Friday and sunday 07/16/20   Ralene Bathe, MD  fluticasone [facility]) 50 MCG/ACT nasal spray Place 1-2 sprays into the nose daily as needed. For allergies. 01/24/17   [provider]  gabapentin (NEURONTIN) 300 MG capsule Take 300 mg by mouth 2 (two) times daily.    [provider]  glimepiride (AMARYL) 1 MG tablet Take 1 mg by mouth 2 (two) times daily.     [provider]  hydrochlorothiazide (HYDRODIURIL) 25 MG tablet Take 25 mg by mouth daily.     [provider]  HYDROcodone-acetaminophen (NORCO) 5-325 MG tablet Take 1 tablet by mouth every 6 (six) hours as needed for moderate pain. 01/06/18   Troxler, Rodman Key, DPM  hydrocortisone 2.5 % cream Apply topically at bedtime. qhs to aa rash in groin until clear, then prn flares 07/16/20   Ralene Bathe, MD  ketoconazole (NIZORAL) 2 % cream Apply to aa's QAM PRN flares. 08/27/20   Ralene Bathe, MD  lisinopril (PRINIVIL,ZESTRIL) 10 MG tablet Take 10 mg by mouth daily.     [provider]  metFORMIN (GLUCOPHAGE) 1000 MG tablet Take 1,000 mg by mouth 2 (two) times daily. Take 1 tablet (1000 mg) in the morning & 1.5 tablets (1500 mg) at night    [provider]  Multiple Vitamin (MULTIVITAMIN WITH MINERALS) TABS tablet Take 1 tablet by mouth daily.    [provider]  Multiple Vitamins-Minerals (PRESERVISION AREDS 2 PO) Take 1 tablet by mouth 2 (two) times daily.    [provider]  omeprazole (PRILOSEC) 20 MG capsule Take 20 mg by mouth daily before breakfast. as needed     [provider]  pantoprazole (PROTONIX) 40 MG tablet Take 40 mg by mouth 2 (two) times daily. 12/08/21   [provider]  pioglitazone (ACTOS) 15 MG tablet  03/15/22   [provider]  tobramycin (TOBREX) 0.3 % ophthalmic solution Apply 1 drop to eye as  directed. Use after eylea eye injections as instructed. 07/27/16   [provider]  vitamin C (ASCORBIC ACID) 500 MG tablet Take 500 mg by mouth daily.    [provider]  vitamin E 400 UNIT capsule Take 400 Units by mouth daily.    [provider]   Physical Exam: Vitals:   05/13/22 1313 05/13/22 1327 05/13/22 1410 05/13/22 1835  BP: (!) 146/64  138/70 132/68  Pulse: 78 71 71 75  Resp: '18 15 15 16  '$ Temp: 98.5 F (36.9 C)   98.7 F (37.1 C)  TempSrc: Oral   Oral  SpO2: Mar[place] Kitchen)  87% 100% 99% 100%  Weight:      Height:       Constitutional: appears age-appropriate, NAD, calm, comfortable Eyes: PERRL, lids and conjunctivae normal ENMT: Mucous membranes are moist. Posterior pharynx clear of any exudate or lesions. Age-appropriate dentition. Hearing appropriate Neck: normal, supple, no masses, no thyromegaly Respiratory: clear to auscultation bilaterally, no wheezing, no crackles. Normal respiratory effort. No accessory muscle use.  Cardiovascular: Regular rate and rhythm, no murmurs / rubs / gallops. No extremity edema. 2+ pedal pulses. No carotid bruits.  Abdomen: Obese abdomen, no tenderness, no masses palpated, no hepatosplenomegaly. Bowel sounds positive.  Musculoskeletal: no clubbing / cyanosis. No joint deformity upper and lower extremities. Good ROM, no contractures, no atrophy. Normal muscle tone.  Skin: no rashes, lesions, ulcers. No induration Neurologic: Sensation intact. Strength 5/5 in all 4.  Psychiatric: Normal judgment and insight. Alert and oriented x 3. Normal mood.   EKG: independently reviewed, showing sinus rhythm with rate of 77, QTc 425  Chest x-ray on Admission: I personally reviewed and I agree with radiologist reading as below.  CT Angio Chest PE W/Cm &/Or Wo Cm  Result Date: 05/13/2022 CLINICAL DATA:  Chest pain, shortness of breath EXAM: CT ANGIOGRAPHY CHEST WITH CONTRAST TECHNIQUE: Multidetector CT imaging of the chest was performed  using the standard protocol during bolus administration of intravenous contrast. Multiplanar CT image reconstructions and MIPs were obtained to evaluate the vascular anatomy. RADIATION DOSE REDUCTION: This exam was performed according to the departmental dose-optimization program which includes automated exposure control, adjustment of the mA and/or kV according to patient size and/or use of iterative reconstruction technique. CONTRAST:  49m OMNIPAQUE IOHEXOL 350 MG/ML SOLN COMPARISON:  Chest x-ray 09/01/2014 FINDINGS: Cardiovascular: Satisfactory opacification of the pulmonary arteries to the segmental level. No evidence of pulmonary embolism. Thoracic aorta is nonaneurysmal. Atherosclerotic calcification of the aorta and coronary arteries. Normal heart size. No pericardial effusion. Mediastinum/Nodes: No enlarged mediastinal, hilar, or axillary lymph nodes. Thyroid gland, trachea, and esophagus demonstrate no significant findings. Small-moderate-sized hiatal hernia. Lungs/Pleura: 5 mm noncalcified pulmonary nodule at the left lung apex (series 7, image 13). There are a few additional tiny 2-3 mm scattered pulmonary nodules. Lungs are otherwise clear. No pleural effusion or pneumothorax. Upper Abdomen: No acute abnormality. Musculoskeletal: No chest wall abnormality. No acute or significant osseous findings. Review of the MIP images confirms the above findings. IMPRESSION: 1. No acute pulmonary embolism or other acute intrathoracic findings. 2. Small-moderate-sized hiatal hernia. 3. There are a few scattered pulmonary nodules, largest measuring up to 5 mm at the left lung apex. No follow-up needed if patient is low-risk (and has no known or suspected primary neoplasm). Non-contrast chest CT can be considered in 12 months if patient is high-risk. This recommendation follows the consensus statement: Guidelines for Management of Incidental Pulmonary Nodules Detected on CT Images: From the Fleischner Society 2017;  Radiology 2017; 284:228-243. 4. Aortic and coronary artery atherosclerosis (ICD10-I70.0). Electronically Signed   By: NDavina PokeD.O.   On: 05/13/2022 14:54    Labs on Admission: I have personally reviewed following labs  CBC: Recent Labs  Lab 05/13/22 1319  WBC 6.8  HGB 10.3*  HCT 32.5*  MCV 83.3  PLT 3947  Basic Metabolic Panel: Recent Labs  Lab 05/13/22 1319 05/13/22 1343  NA 133*  --   K 4.1  --   CL 101  --   CO2 24  --   GLUCOSE 158*  --   BUN 23  --  CREATININE 0.75  --   CALCIUM 9.5  --   MG  --  1.3*   GFR: Estimated Creatinine Clearance: 58.1 mL/min (by C-G formula based on SCr of 0.75 mg/dL).  Thyroid Function Tests: Recent Labs    05/13/22 1343  TSH 3.209   Urine analysis:    Component Value Date/Time   COLORURINE YELLOW (A) 05/13/2022 1500   APPEARANCEUR CLEAR (A) 05/13/2022 1500   LABSPEC 1.024 05/13/2022 1500   PHURINE 5.0 05/13/2022 1500   GLUCOSEU NEGATIVE 05/13/2022 1500   HGBUR NEGATIVE 05/13/2022 1500   BILIRUBINUR NEGATIVE 05/13/2022 1500   KETONESUR NEGATIVE 05/13/2022 1500   PROTEINUR NEGATIVE 05/13/2022 1500   NITRITE NEGATIVE 05/13/2022 1500   LEUKOCYTESUR TRACE (A) 05/13/2022 1500   Dr. Tobie Poet [facility]  If 7PM-7AM, please contact overnight-coverage provider If 7AM-7PM, please contact day coverage provider [email or web address]  05/13/2022, 7:10 PM

## 2022-05-13 NOTE — Assessment & Plan Note (Signed)
-   Etiology work-up in progress - Second troponin and BNP are pending collection - Cardiology has been consulted for consideration of inpatient stress test - Orthostatic vital signs ordered - MRI of the brain without contrast has been ordered - Telemetry cardiac, observation

## 2022-05-13 NOTE — Assessment & Plan Note (Signed)
-   Atorvastatin 40 mg nightly resumed 

## 2022-05-13 NOTE — ED Notes (Signed)
Patient transported to MRI by MRI tech.

## 2022-05-13 NOTE — Assessment & Plan Note (Signed)
-   Status post replace with magnesium 2 g IV per EDP - Check magnesium in the a.m.

## 2022-05-13 NOTE — ED Provider Notes (Signed)
[facility] Provider Note    Event Date/Time   First MD Initiated Contact with Patient 05/13/22 1321     (approximate)   History   Chief Complaint Dizziness   HPI  Laura Mcdaniel is a 76 y.o. female with past medical history of hypertension and diabetes who presents to the ED for dizziness.  Patient reports that she has been having intermittent episodes of dizziness and lightheadedness for about the past month.  She states that they have been increasing in frequency, were initially occurring about once a week however she had 2 episodes yesterday.  When she discussed this with her PCP, they recommended she come to the ED for further evaluation.  She states these episodes have been associated with pressure in her chest as well as some difficulty breathing.  She notices the chest pressure and difficulty breathing with the episodes, but can also have the symptoms between episodes.  She endorses some dyspnea on exertion but denies any fevers, cough, leg swelling or pain.  She reports some mild chest pressure currently.     Physical Exam   Triage Vital Signs: ED Triage Vitals  Enc Vitals Group     BP 05/13/22 1313 (!) 146/64     Pulse Rate 05/13/22 1313 78     Resp 05/13/22 1313 18     Temp 05/13/22 1313 98.5 F (36.9 C)     Temp Source 05/13/22 1313 Oral     SpO2 05/13/22 1313 (!) 87 %     Weight 05/13/22 1312 179 lb (81.2 kg)     Height 05/13/22 1312 '5\' 2"'$  (1.575 m)     Head Circumference --      Peak Flow --      Pain Score 05/13/22 1312 5     Pain Loc --      Pain Edu? --      Excl. in [place]? --     Most recent vital signs: Vitals:   05/13/22 1327 05/13/22 1410  BP:  138/70  Pulse: 71 71  Resp: 15 15  Temp:    SpO2: 100% 99%    Constitutional: Alert and oriented. Eyes: Conjunctivae are normal. Head: Atraumatic. Nose: No congestion/rhinnorhea. Mouth/Throat: Mucous membranes are moist.  Cardiovascular: Normal rate, regular rhythm. Grossly  normal heart sounds.  2+ radial pulses bilaterally. Respiratory: Normal respiratory effort.  No retractions. Lungs CTAB. Gastrointestinal: Soft and nontender. No distention. Musculoskeletal: No lower extremity tenderness nor edema.  Neurologic:  Normal speech and language. No gross focal neurologic deficits are appreciated.    ED Results / Procedures / Treatments   Labs (all labs ordered are listed, but only abnormal results are displayed) Labs Reviewed  BASIC METABOLIC PANEL - Abnormal; Notable for the following components:      Result Value   Sodium 133 (*)    Glucose, Bld 158 (*)    All other components within normal limits  CBC - Abnormal; Notable for the following components:   Hemoglobin 10.3 (*)    HCT 32.5 (*)    All other components within normal limits  MAGNESIUM - Abnormal; Notable for the following components:   Magnesium 1.3 (*)    All other components within normal limits  TSH  URINALYSIS, ROUTINE W REFLEX MICROSCOPIC  CBG MONITORING, ED  TROPONIN I (HIGH SENSITIVITY)     EKG  ED ECG REPORT I, Blake Divine, the attending physician, personally viewed and interpreted this ECG.   Date: 05/13/2022  EKG Time: 13:11  Rate: 77  Rhythm: normal sinus rhythm  Axis: Normal  Intervals:none  ST&T Change: None  RADIOLOGY CTA chest reviewed and interpreted by me with no pulmonary embolism or focal infiltrate.  PROCEDURES:  Critical Care performed: No  Procedures   MEDICATIONS ORDERED IN ED: Medications  magnesium sulfate IVPB 2 g 50 mL (has no administration in time range)  iohexol (OMNIPAQUE) 350 MG/ML injection 75 mL (75 mLs Intravenous Contrast Given 05/13/22 1419)     IMPRESSION / MDM / ASSESSMENT AND PLAN / ED COURSE  I reviewed the triage vital signs and the nursing notes.                              76 y.o. female with past medical history of hypertension and diabetes who presents to the ED with intermittent episodes of lightheadedness and  near syncope associated with chest pain and shortness of breath for the past month.  Patient's presentation is most consistent with acute presentation with potential threat to life or bodily function.  Differential diagnosis includes, but is not limited to, ACS, PE, arrhythmia, atrial fibrillation, ventricular tachycardia, electrolyte abnormality, dehydration, anemia, AKI.  Patient well-appearing and in no acute distress, vital signs remarkable for hypoxia noted in triage at 87%, patient subsequently 100% on 2 L nasal cannula on my assessment.  Oxygen was turned off and patient did not have any desaturations during our discussion, denies any shortness of breath currently but does endorse mild chest discomfort.  EKG shows no evidence of arrhythmia or ischemia but described episodes are concerning for cardiac etiology for her near syncope.  We will further assess with CTA for PE, observe on cardiac monitor and check troponin.  Labs thus far are reassuring with no significant anemia or leukocytosis, no AKI or electrolyte abnormality noted.  CTA is negative for PE or other acute process, no events noted on cardiac monitor, and troponin within normal limits.  Patient noted to be hypomagnesemic but other electrolytes within normal limits with no AKI, no significant anemia or leukocytosis noted.  Patient would benefit from further cardiac work-up for intermittent near syncope with chest pain and shortness of breath.  Case discussed with hospitalist for admission.      FINAL CLINICAL IMPRESSION(S) / ED DIAGNOSES   Final diagnoses:  Near syncope  Chest pain, unspecified type     Rx / DC Orders   ED Discharge Orders     None        Note:  This document was prepared using Dragon voice recognition software and may include unintentional dictation errors.   Blake Divine, MD 05/13/22 [phone]

## 2022-05-14 ENCOUNTER — Observation Stay
Admit: 2022-05-14 | Discharge: 2022-05-14 | Disposition: A | Discharge status: Home / Self Care | Payer: PPO | Attending: Nurse Practitioner | Admitting: Nurse Practitioner

## 2022-05-14 ENCOUNTER — Observation Stay (HOSPITAL_BASED_OUTPATIENT_CLINIC_OR_DEPARTMENT_OTHER)
Admit: 2022-05-14 | Discharge: 2022-05-14 | Disposition: A | Discharge status: Home / Self Care | Payer: PPO | Attending: Internal Medicine | Admitting: Internal Medicine

## 2022-05-14 ENCOUNTER — Observation Stay (HOSPITAL_BASED_OUTPATIENT_CLINIC_OR_DEPARTMENT_OTHER)
Admit: 2022-05-14 | Discharge: 2022-05-14 | Disposition: A | Discharge status: Home / Self Care | Payer: PPO | Attending: Nurse Practitioner | Admitting: Nurse Practitioner

## 2022-05-14 ENCOUNTER — Observation Stay: Admit: 2022-05-14 | Payer: PPO

## 2022-05-14 ENCOUNTER — Observation Stay (HOSPITAL_BASED_OUTPATIENT_CLINIC_OR_DEPARTMENT_OTHER): Payer: PPO

## 2022-05-14 ENCOUNTER — Encounter: Payer: Self-pay | Admitting: Internal Medicine

## 2022-05-14 DIAGNOSIS — R55 Syncope and collapse: Secondary | ICD-10-CM

## 2022-05-14 DIAGNOSIS — R0789 Other chest pain: Secondary | ICD-10-CM

## 2022-05-14 DIAGNOSIS — I10 Essential (primary) hypertension: Secondary | ICD-10-CM | POA: Diagnosis not present

## 2022-05-14 LAB — CBC
HCT: 31.8 % — ABNORMAL LOW (ref 36.0–46.0)
Hemoglobin: 10.2 g/dL — ABNORMAL LOW (ref 12.0–15.0)
MCH: 26.6 pg (ref 26.0–34.0)
MCHC: 32.1 g/dL (ref 30.0–36.0)
MCV: 83 fL (ref 80.0–100.0)
Platelets: 331 10*3/uL (ref 150–400)
RBC: 3.83 MIL/uL — ABNORMAL LOW (ref 3.87–5.11)
RDW: 15.1 % (ref 11.5–15.5)
WBC: 6.4 10*3/uL (ref 4.0–10.5)
nRBC: 0 % (ref 0.0–0.2)

## 2022-05-14 LAB — NM MYOCAR MULTI W/SPECT W/WALL MOTION / EF
Estimated workload: 1
Exercise duration (min): 0 min
Exercise duration (sec): 0 s
LV dias vol: 41 mL (ref 46–106)
LV sys vol: 17 mL
MPHR: 143 {beats}/min
Nuc Stress EF: 59 %
Peak HR: 111 {beats}/min
Percent HR: 77 %
Rest HR: 63 {beats}/min
Rest Nuclear Isotope Dose: 9.9 mCi
SDS: 0
SRS: 0
SSS: 0
ST Depression (mm): 0 mm
Stress Nuclear Isotope Dose: 31 mCi
TID: 0.82

## 2022-05-14 LAB — VITAMIN B12: Vitamin B-12: 461 pg/mL (ref 180–914)

## 2022-05-14 LAB — ECHOCARDIOGRAM COMPLETE
AR max vel: 3.22 cm2
AV Area VTI: 4.09 cm2
AV Area mean vel: 2.89 cm2
AV Mean grad: 3 mmHg
AV Peak grad: 4.8 mmHg
Ao pk vel: 1.1 m/s
Area-P 1/2: 11.49 cm2
Height: 62 in
S' Lateral: 2.66 cm
Weight: 2864 oz

## 2022-05-14 LAB — BASIC METABOLIC PANEL
Anion gap: 8 (ref 5–15)
BUN: 18 mg/dL (ref 8–23)
CO2: 28 mmol/L (ref 22–32)
Calcium: 9.6 mg/dL (ref 8.9–10.3)
Chloride: 103 mmol/L (ref 98–111)
Creatinine, Ser: 0.55 mg/dL (ref 0.44–1.00)
GFR, Estimated: >60 mL/min (ref >60)
Glucose, Bld: 164 mg/dL — ABNORMAL HIGH (ref 70–99)
Potassium: 3.8 mmol/L (ref 3.5–5.1)
Sodium: 139 mmol/L (ref 135–145)

## 2022-05-14 LAB — CBG MONITORING, ED: Glucose-Capillary: 178 mg/dL — ABNORMAL HIGH (ref 70–99)

## 2022-05-14 LAB — GLUCOSE, CAPILLARY
Glucose-Capillary: 191 mg/dL — ABNORMAL HIGH (ref 70–99)
Glucose-Capillary: 218 mg/dL — ABNORMAL HIGH (ref 70–99)

## 2022-05-14 LAB — MAGNESIUM: Magnesium: 1.4 mg/dL — ABNORMAL LOW (ref 1.7–2.4)

## 2022-05-14 LAB — HEMOGLOBIN A1C
Hgb A1c MFr Bld: 7.1 % — ABNORMAL HIGH (ref 4.8–5.6)
Mean Plasma Glucose: 157.07 mg/dL

## 2022-05-14 MED ORDER — TECHNETIUM TC 99M TETROFOSMIN IV KIT
10.0000 | PACK | Freq: Once | INTRAVENOUS | Status: AC | PRN
  Administered 2022-05-14: 9.93 via INTRAVENOUS

## 2022-05-14 MED ORDER — MAGNESIUM SULFATE 4 GM/100ML IV SOLN
4.0000 g | Freq: Once | INTRAVENOUS | Status: AC
  Administered 2022-05-14: 4 g via INTRAVENOUS
  Filled 2022-05-14: qty 100

## 2022-05-14 MED ORDER — REGADENOSON 0.4 MG/5ML IV SOLN
0.4000 mg | Freq: Once | INTRAVENOUS | Status: AC
Start: 2022-05-14 — End: 2022-05-14
  Administered 2022-05-14: 0.4 mg via INTRAVENOUS

## 2022-05-14 MED ORDER — ORAL CARE MOUTH RINSE
15.0000 mL | OROMUCOSAL | Status: DC | PRN
Start: 2022-05-14 — End: 2022-05-14

## 2022-05-14 MED ORDER — TECHNETIUM TC 99M TETROFOSMIN IV KIT
30.9500 | PACK | Freq: Once | INTRAVENOUS | Status: AC | PRN
  Administered 2022-05-14: 30.95 via INTRAVENOUS

## 2022-05-14 NOTE — Progress Notes (Signed)
*  PRELIMINARY RESULTS* Echocardiogram 2D Echocardiogram has been performed.  Laura Mcdaniel 05/14/2022, 4:00 PM

## 2022-05-14 NOTE — Care Management (Signed)
06/16 RNCM attempted to see patient.  Patient not in room at the time of visit.

## 2022-05-14 NOTE — Discharge Summary (Signed)
Physician Discharge Summary   Patient: Laura Mcdaniel MRN: [id number] DOB: [birth year]  Admit date:     05/13/2022  Discharge date: 05/14/22  Discharge Physician: Sharen Hones   PCP: Marinda Elk, MD   Recommendations at discharge:   Follow-up with PCP in 1 week. Follow-up with cardiology as scheduled.  Discharge Diagnoses: Principal Problem:   Near syncope Active Problems:   Left chest pressure   Hypomagnesemia   Neuropathy   Essential hypertension   B12 deficiency   Hyperlipidemia   GERD (gastroesophageal reflux disease)   Uncontrolled type 2 diabetes mellitus with hyperglycemia, without long-term current use of insulin (HCC)  Resolved Problems:   * No resolved hospital problems. [facility] Course: Ms. Che Below is a 76 year old female with history of GERD, hypertension, hyperlipidemia, neuropathy, non-insulin-dependent diabetes mellitus, who presents emergency department for chief concerns of near syncope, and chest pain with dyspnea on exertion.  Her symptom has been happening for the last few months. After arriving the hospital, patient was seen by cardiology, telemetry did not show any arrhythmia, echocardiogram is performed, pending results. Patient also had a nuclear stress test, and has a normal ejection fraction, no ischemic changes.  A ZIO monitor was placed by cardiology, she will follow-up with cardiology as outpatient to review ZIO results as well as echocardiogram results.  Assessment and Plan: Near syncope. Etiology is unclear, work-up so far been negative.  MRI of the brain did not show any acute changes, CT angiogram of the chest showed a few less than 0.5 cm nodules, no PE. Patient will be discharged home with a ZIO recorder.  And to follow with cardiology.  Chest pressure Symptom pretty mild, has been happening for months.  Nuclear stress test did not show any ischemic changes.  Patient be followed by cardiology as outpatient.  B12  deficiency. Peripheral neuropathy. Follow-up with PCP as outpatient  Hypomagnesemia. Patient received total of 6 g of magnesium sulfate for magnesium level 1.3.  Follow-up with PCP as outpatient.  Type 2 diabetes. Resume all home medicines  Obesity with BMI of 32.74. Diet and exercise advised.      Consultants: Cardiology Procedures performed: None  Disposition: Home Diet recommendation:  Discharge Diet Orders (From admission, onward)     Start     Ordered   05/14/22 0000  Diet - low sodium heart healthy        05/14/22 1526           Cardiac diet DISCHARGE MEDICATION: Allergies as of 05/14/2022       Reactions   Mobic [meloxicam]    Light headed and confusion    Vicodin [hydrocodone-acetaminophen] Nausea Only        Medication List     STOP taking these medications    alendronate 70 MG tablet Commonly known as: FOSAMAX   ferrous sulfate 325 (65 FE) MG tablet   tobramycin 0.3 % ophthalmic solution Commonly known as: TOBREX       TAKE these medications    ASPIRIN EC PO Take 325 mg by mouth at bedtime.   atorvastatin 40 MG tablet Commonly known as: LIPITOR Take 40 mg by mouth at bedtime.   CALCIUM 600+D3 PO Take 1 tablet by mouth 2 (two) times daily.   EYLEA IO Inject 1 Syringe into the eye as directed. Per ophthamology Office for macular degeneration   fluticasone 50 MCG/ACT nasal spray Commonly known as: FLONASE Place 1-2 sprays into the nose daily as needed. For allergies.  gabapentin 300 MG capsule Commonly known as: NEURONTIN Take 600 mg by mouth at bedtime.   glimepiride 1 MG tablet Commonly known as: AMARYL Take 1 mg by mouth 2 (two) times daily.   hydrochlorothiazide 25 MG tablet Commonly known as: HYDRODIURIL Take 25 mg by mouth daily.   lisinopril 10 MG tablet Commonly known as: ZESTRIL Take 10 mg by mouth daily.   metFORMIN 1000 MG tablet Commonly known as: GLUCOPHAGE Take 1,000 mg by mouth 2 (two) times daily.    multivitamin with minerals Tabs tablet Take 1 tablet by mouth daily.   omeprazole 20 MG capsule Commonly known as: PRILOSEC Take 20 mg by mouth daily before breakfast. as needed   pantoprazole 40 MG tablet Commonly known as: PROTONIX Take 40 mg by mouth 2 (two) times daily.   pioglitazone 15 MG tablet Commonly known as: ACTOS Take 15 mg by mouth at bedtime.   PRESERVISION AREDS 2 PO Take 1 tablet by mouth 2 (two) times daily.   vitamin C 500 MG tablet Commonly known as: ASCORBIC ACID Take 500 mg by mouth daily.   vitamin E 180 MG (400 UNITS) capsule Take 400 Units by mouth daily.        Follow-up Information     Furth, Cadence H, PA-C Follow up on 06/08/2022.   Specialty: Cardiology Why: 2:45 PM Contact information: [place] [id number] [phone]         Marinda Elk, MD Follow up in 1 week(s).   Specialty: Physician Assistant Contact information: [place] [address] [phone]         Wellington Hampshire, MD .   Specialty: Cardiology Contact information: [place] [address] [phone]                Discharge Exam: Danley Danker Weights   05/13/22 1312  Weight: 81.2 kg   General exam: Appears calm and comfortable  Respiratory system: Clear to auscultation. Respiratory effort normal. Cardiovascular system: S1 & S2 heard, RRR. No JVD, murmurs, rubs, gallops or clicks. No pedal edema. Gastrointestinal system: Abdomen is nondistended, soft and nontender. No organomegaly or masses felt. Normal bowel sounds heard. Central nervous system: Alert and oriented. No focal neurological deficits. Extremities: Symmetric 5 x 5 power. Skin: No rashes, lesions or ulcers Psychiatry: Judgement and insight appear normal. Mood & affect appropriate.    Condition at discharge: good  The results of significant diagnostics from this  hospitalization (including imaging, microbiology, ancillary and laboratory) are listed below for reference.   Imaging Studies: NM Myocar Multi W/Spect W/Wall Motion / EF  Result Date: 05/14/2022   The study is normal. The study is low risk.   No ST deviation was noted.   LV perfusion is normal. There is no evidence of ischemia. There is no evidence of infarction.   Left ventricular function is normal. End diastolic cavity size is normal. End systolic cavity size is normal.   CT attenuation images with mild aortic and coronary calcifications   MR BRAIN WO CONTRAST  Result Date: 05/14/2022 CLINICAL DATA:  Initial evaluation for acute TIA. EXAM: MRI HEAD WITHOUT CONTRAST TECHNIQUE: Multiplanar, multiecho pulse sequences of the brain and surrounding structures were obtained without intravenous contrast. COMPARISON:  Prior MRI from 10/12/2018. FINDINGS: Brain: Examination mildly limited as the coronal DWI sequence is degraded by technical artifact. Cerebral volume within normal limits for age. No more than mild chronic microvascular  ischemic disease noted involving the periventricular and deep white matter both cerebral hemispheres. Small remote lacunar infarct at the right basal ganglia. Small remote right cerebellar infarct. No evidence for acute or subacute ischemia. Gray-white matter differentiation otherwise maintained. No acute intracranial hemorrhage. Focal prominence of the right dorsal medulla without discrete mass, stable from previous. No other mass lesion, midline shift or mass effect. Mild ventricular prominence without hydrocephalus, likely related atrophy, similar to previous. No extra-axial fluid collection. Pituitary gland suprasellar region normal. Vascular: Major intracranial vascular flow voids are maintained. Skull and upper cervical spine: Craniocervical junction with normal limits. Bone marrow signal intensity normal. No scalp soft tissue abnormality. Sinuses/Orbits: Prior bilateral ocular  lens replacement. Mild mucosal thickening about the ethmoidal air cells, left sphenoid sinus, left maxillary sinus. Trace bilateral mastoid effusions, of doubtful significance. Other: None. IMPRESSION: 1. No acute intracranial abnormality. 2. 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 Electronically Signed   By: Jeannine Boga M.D.   On: 05/14/2022 00:03   CT Angio Chest PE W/Cm &/Or Wo Cm  Result Date: 05/13/2022 CLINICAL DATA:  Chest pain, shortness of breath EXAM: CT ANGIOGRAPHY CHEST WITH CONTRAST TECHNIQUE: Multidetector CT imaging of the chest was performed using the standard protocol during bolus administration of intravenous contrast. Multiplanar CT image reconstructions and MIPs were obtained to evaluate the vascular anatomy. RADIATION DOSE REDUCTION: This exam was performed according to the departmental dose-optimization program which includes automated exposure control, adjustment of the mA and/or kV according to patient size and/or use of iterative reconstruction technique. CONTRAST:  35m OMNIPAQUE IOHEXOL 350 MG/ML SOLN COMPARISON:  Chest x-ray 09/01/2014 FINDINGS: Cardiovascular: Satisfactory opacification of the pulmonary arteries to the segmental level. No evidence of pulmonary embolism. Thoracic aorta is nonaneurysmal. Atherosclerotic calcification of the aorta and coronary arteries. Normal heart size. No pericardial effusion. Mediastinum/Nodes: No enlarged mediastinal, hilar, or axillary lymph nodes. Thyroid gland, trachea, and esophagus demonstrate no significant findings. Small-moderate-sized hiatal hernia. Lungs/Pleura: 5 mm noncalcified pulmonary nodule at the left lung apex (series 7, image 13). There are a few additional tiny 2-3 mm scattered pulmonary nodules. Lungs are otherwise clear. No pleural effusion or pneumothorax. Upper Abdomen: No acute abnormality. Musculoskeletal: No chest wall  abnormality. No acute or significant osseous findings. Review of the MIP images confirms the above findings. IMPRESSION: 1. No acute pulmonary embolism or other acute intrathoracic findings. 2. Small-moderate-sized hiatal hernia. 3. There are a few scattered pulmonary nodules, largest measuring up to 5 mm at the left lung apex. No follow-up needed if patient is low-risk (and has no known or suspected primary neoplasm). Non-contrast chest CT can be considered in 12 months if patient is high-risk. This recommendation follows the consensus statement: Guidelines for Management of Incidental Pulmonary Nodules Detected on CT Images: From the Fleischner Society 2017; Radiology 2017; 284:228-243. 4. Aortic and coronary artery atherosclerosis (ICD10-I70.0). Electronically Signed   By: NDavina PokeD.O.   On: 05/13/2022 14:54    Microbiology: Results for orders placed or performed during the hospital encounter of 12/15/17  Gastrointestinal Panel by PCR , Stool     Status: None   Collection Time: 12/15/17  8:30 AM   Specimen: Stool  Result Value Ref Range Status   Campylobacter species NOT DETECTED NOT DETECTED Final   Plesimonas shigelloides NOT DETECTED NOT DETECTED Final   Salmonella species NOT DETECTED NOT DETECTED Final   Yersinia enterocolitica NOT DETECTED NOT DETECTED Final   Vibrio species  NOT DETECTED NOT DETECTED Final   Vibrio cholerae NOT DETECTED NOT DETECTED Final   Enteroaggregative E coli (EAEC) NOT DETECTED NOT DETECTED Final   Enteropathogenic E coli (EPEC) NOT DETECTED NOT DETECTED Final   Enterotoxigenic E coli (ETEC) NOT DETECTED NOT DETECTED Final   Shiga like toxin producing E coli (STEC) NOT DETECTED NOT DETECTED Final   Shigella/Enteroinvasive E coli (EIEC) NOT DETECTED NOT DETECTED Final   Cryptosporidium NOT DETECTED NOT DETECTED Final   Cyclospora cayetanensis NOT DETECTED NOT DETECTED Final   Entamoeba histolytica NOT DETECTED NOT DETECTED Final   Giardia lamblia NOT  DETECTED NOT DETECTED Final   Adenovirus F40/41 NOT DETECTED NOT DETECTED Final   Astrovirus NOT DETECTED NOT DETECTED Final   Norovirus GI/GII NOT DETECTED NOT DETECTED Final   Rotavirus A NOT DETECTED NOT DETECTED Final   Sapovirus (I, II, IV, and V) NOT DETECTED NOT DETECTED Final    Comment: Performed at [facility], [address] [place], [place] [address]    Labs: CBC: Recent Labs  Lab 05/13/22 1319 05/14/22 0510  WBC 6.8 6.4  HGB 10.3* 10.2*  HCT 32.5* 31.8*  MCV 83.3 83.0  PLT 334 885   Basic Metabolic Panel: Recent Labs  Lab 05/13/22 1319 05/13/22 1343 05/14/22 0510  NA 133*  --  139  K 4.1  --  3.8  CL 101  --  103  CO2 24  --  28  GLUCOSE 158*  --  164*  BUN 23  --  18  CREATININE 0.75  --  0.55  CALCIUM 9.5  --  9.6  MG  --  1.3* 1.4*   Liver Function Tests: No results for input(s): "AST", "ALT", "ALKPHOS", "BILITOT", "PROT", "ALBUMIN" in the last 168 hours. CBG: Recent Labs  Lab 05/13/22 2332 05/14/22 0722 05/14/22 1307  GLUCAP 147* 178* 191*    Discharge time spent: less than 30 minutes.  Signed: Sharen Hones, MD [facility] 05/14/2022

## 2022-05-14 NOTE — Progress Notes (Signed)
Pt green bag from ED located and delivered to pt.  Pt purse and green bag kept at pt bedside.  Pt satisfied with belongings at bedside.

## 2022-05-14 NOTE — Care Management (Signed)
toc

## 2022-05-14 NOTE — ED Notes (Signed)
Pt's IV site had become bloody. This nurse cleaned site and tegaderm removed. J loop was tightened and loop was flushed. Pt had no leaking around site and no pain. New tegaderm and tape put in place.

## 2022-05-14 NOTE — Progress Notes (Addendum)
Discharge instructions explained to pt and pts husband/ verbalized an understanding/ iv and tele removed/ discharged with ZOI monitor / will transport off unit via wheelchair.

## 2022-05-14 NOTE — Consult Note (Signed)
Cardiology Consult    Patient ID: Laura Mcdaniel MRN: [id number], DOB/AGE: 03-20-1946   Admit date: 05/13/2022 Date of Consult: 05/14/2022  Primary Physician: Marinda Elk, MD Primary Cardiologist: Kathlyn Sacramento, MD - new Requesting Provider: Elenora Gamma, MD  Patient Profile    Laura Mcdaniel is a 76 y.o. female with a history of HTN, HL, DMII, neuropathy, and GERD, who is being seen today for the evaluation of chest pain and pre-syncope at the request of Dr. Roosevelt Locks.  Past Medical History   Past Medical History:  Diagnosis Date   Anemia    B12 deficiency    Carpal tunnel syndrome    Diabetes mellitus without complication (HCC)    Diarrhea    Difficult intubation    DJD (degenerative joint disease)    Enlarged RV (right ventricle)    Enlarged thyroid gland 1970's   GERD (gastroesophageal reflux disease)    Hearing loss    Hiatal hernia    Hypertension    IBS (irritable bowel syndrome)    Macular degeneration    Mixed hyperlipidemia    Peripheral neuropathy    Pre-syncope    a. 04/2022 nl Holter.   Stomatitis    Type 2 diabetes mellitus ([place])    Valvular heart disease    a. 08/2018 [facility]: Nl LV fxn. Triv AI, mld MR/TR/PR.    Past Surgical History:  Procedure Laterality Date   ABDOMINAL HYSTERECTOMY     APPENDECTOMY     carpel tunnel Bilateral    CATARACT EXTRACTION Bilateral    COLONOSCOPY WITH PROPOFOL N/A 12/23/2017   Procedure: COLONOSCOPY WITH PROPOFOL;  Surgeon: Manya Silvas, MD;  Location: [facility] ENDOSCOPY;  Service: Endoscopy;  Laterality: N/A;   EYE SURGERY     bilateral   HAMMER TOE SURGERY Left 01/06/2018   Procedure: HAMMER TOE CORRECTION-2ND & 3RD TOES;  Surgeon: Albertine Patricia, DPM;  Location: [facility] ORS;  Service: Podiatry;  Laterality: Left;   JOINT REPLACEMENT     left knee2/9/09 right knee 2017   KNEE ARTHROPLASTY Right 01/19/2016   Procedure: COMPUTER ASSISTED TOTAL KNEE ARTHROPLASTY;  Surgeon: Dereck Leep, MD;  Location: [facility] ORS;   Service: Orthopedics;  Laterality: Right;   left knee surgery x2     Left total knee arthroplasty  2009   Dr. Marry Guan   SHOULDER SURGERY Left    THYROIDECTOMY     TONSILLECTOMY     WEIL OSTEOTOMY Left 01/06/2018   Procedure: WEIL OSTEOTOMY-2ND METATARSAL;  Surgeon: Albertine Patricia, DPM;  Location: [facility] ORS;  Service: Podiatry;  Laterality: Left;     Allergies  Allergies  Allergen Reactions   Mobic [Meloxicam]     Light headed and confusion    Vicodin [Hydrocodone-Acetaminophen] Nausea Only    History of Present Illness    76 year old female with above past medical history including hypertension, hyperlipidemia, type 2 diabetes mellitus, neuropathy, and GERD.  She also notes a several year history of presyncope, occurring as frequently as 3 times per month.  Symptoms may occur while sitting or standing, typically without any warning or identified triggers, and begin as feeling lightheaded followed by diaphoresis and nausea.  With this, her vision begins to black in, though she notes she has never lost consciousness.  She has fallen on several occasions.  She typically feels poorly for 10 to 15 minutes before symptoms resolve.  She recently wore a Holter monitor x48 hours, which did not show any acute findings per patient and notes in care  everywhere.  She also reports a several year history of exertional dyspnea and mild chest tightness, that has been occurring with less and less provocation over the past year or more.  She thinks that if she had to walk 100 yards, she would probably feel mild tightness and short of breath, and would stop and rest.  On June 14, she had an episode of presyncope while sitting at home, which she notes was not as severe as some prior episodes, and resolve spontaneously.  On the evening of June 14, she went out with her husband and children, and after eating at [employer], she was leaving the restaurant and had sudden onset of lightheadedness with diaphoresis,  nausea, and presyncope.  Symptoms resolved within 10 to 15 minutes after she was seated.  She later noted mild tightness in her chest and dyspnea when walking.  She had no recurrent symptoms on the morning of June 15 but when she contacted her primary care provider's office, she was advised to present to the ED for further evaluation.  Here, ECG showed RSR @ 77 w/ poor R progression - ? Prior antsept infarct.  HsTrop nl x 2 @ 5  5.  Labs notable for stable normocytic anemia and hypoMg @ 1.3  1.4.  CTA chest neg for PE but showed small to mod sized HH, small/scattered pulm nodules, and Ao and Coronary atherosclerosis.  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  She is symptom-free this morning.  Telemetry shows sinus rhythm to sinus bradycardia with rates in the 50s and 60s and first-degree AV block.  No evidence of higher-grade heart block or pauses noted.  Her only complaint this morning is that she is hungry.  Inpatient Medications     vitamin C  500 mg Oral Daily   aspirin EC  81 mg Oral QHS   atorvastatin  40 mg Oral QHS   enoxaparin (LOVENOX) injection  40 mg Subcutaneous QHS   ferrous sulfate  325 mg Oral Daily   gabapentin  300 mg Oral BID   hydrochlorothiazide  25 mg Oral Daily   insulin aspart  0-15 Units Subcutaneous TID AC & HS   lisinopril  10 mg Oral Daily   multivitamin with minerals  1 tablet Oral Daily   pantoprazole  40 mg Oral Daily    Family History    Family History  Problem Relation Age of Onset   Cancer Mother    Diabetes Father    Breast cancer Neg Hx    She indicated that her mother is deceased. She indicated that her father is deceased. She indicated that the status of her neg hx is unknown.   Social History    Social History   Socioeconomic History   Marital status: Married    Spouse name: Not on file   Number of children: Not on file   Years of education: Not on file    Highest education level: Not on file  Occupational History   Not on file  Tobacco Use   Smoking status: Never   Smokeless tobacco: Never  Vaping Use   Vaping Use: Never used  Substance and Sexual Activity   Alcohol use: No   Drug use: No   Sexual activity: Not on file  Other Topics Concern   Not on file  Social History Narrative   Lives locally in [place] with her husband.  She is not particularly  active.  Children live nearby.   Social Determinants of Health   Financial Resource Strain: Not on file  Food Insecurity: Not on file  Transportation Needs: Not on file  Physical Activity: Not on file  Stress: Not on file  Social Connections: Not on file  Intimate Partner Violence: Not on file     Review of Systems    General:  No chills, fever, night sweats or weight changes.  Cardiovascular:  +++ occas ex chest tightness, +++ dyspnea on exertion, +++ frequent presyncope, no edema, orthopnea, palpitations, paroxysmal nocturnal dyspnea. Dermatological: No rash, lesions/masses Respiratory: No cough, +++ dyspnea Urologic: No hematuria, dysuria Abdominal:   +++ nausea in the setting of presyncope, +++ occasional vomiting in the setting of presyncope, no diarrhea, bright red blood per rectum, melena, or hematemesis Neurologic:  No visual changes, wkns, changes in mental status. All other systems reviewed and are otherwise negative except as noted above.  Physical Exam    Blood pressure (!) 143/67, pulse 65, temperature 98.1 F (36.7 C), temperature source Oral, resp. rate 14, height '5\' 2"'$  (1.575 m), weight 81.2 kg, SpO2 96 %.  General: Pleasant, NAD Psych: Normal affect. Neuro: Alert and oriented X 3. Moves all extremities spontaneously. HEENT: Normal  Neck: Supple without bruits or JVD. Lungs:  Resp regular and unlabored, CTA. Heart: RRR no s3, s4, or murmurs. Abdomen: Soft, non-tender, non-distended, BS + x 4.  Extremities: No clubbing, cyanosis or edema. DP/PT2+, Radials  2+ and equal bilaterally.  Labs    Cardiac Enzymes Recent Labs  Lab 05/13/22 1343 05/13/22 1919  TROPONINIHS 5 5     BNP    Component Value Date/Time   BNP 51.3 05/13/2022 1919    Lab Results  Component Value Date   WBC 6.4 05/14/2022   HGB 10.2 (L) 05/14/2022   HCT 31.8 (L) 05/14/2022   MCV 83.0 05/14/2022   PLT 331 05/14/2022    Recent Labs  Lab 05/14/22 0510  NA 139  K 3.8  CL 103  CO2 28  BUN 18  CREATININE 0.55  CALCIUM 9.6  GLUCOSE 164*     Radiology Studies    MR BRAIN WO CONTRAST  Result Date: 05/14/2022 CLINICAL DATA:  Initial evaluation for acute TIA. EXAM: MRI HEAD WITHOUT CONTRAST TECHNIQUE: Multiplanar, multiecho pulse sequences of the brain and surrounding structures were obtained without intravenous contrast. COMPARISON:  Prior MRI from 10/12/2018. FINDINGS: Brain: Examination mildly limited as the coronal DWI sequence is degraded by technical artifact. Cerebral volume within normal limits for age. No more than mild chronic microvascular ischemic disease noted involving the periventricular and deep white matter both cerebral hemispheres. Small remote lacunar infarct at the right basal ganglia. Small remote right cerebellar infarct. No evidence for acute or subacute ischemia. Gray-white matter differentiation otherwise maintained. No acute intracranial hemorrhage. Focal prominence of the right dorsal medulla without discrete mass, stable from previous. No other mass lesion, midline shift or mass effect. Mild ventricular prominence without hydrocephalus, likely related atrophy, similar to previous. No extra-axial fluid collection. Pituitary gland suprasellar region normal. Vascular: Major intracranial vascular flow voids are maintained. Skull and upper cervical spine: Craniocervical junction with normal limits. Bone marrow signal intensity normal. No scalp soft tissue abnormality. Sinuses/Orbits: Prior bilateral ocular lens replacement. Mild mucosal thickening  about the ethmoidal air cells, left sphenoid sinus, left maxillary sinus. Trace bilateral mastoid effusions, of doubtful significance. Other: None. IMPRESSION: 1. No acute intracranial abnormality. 2. Mild chronic microvascular ischemic disease with small remote infarcts  involving the right basal ganglia and right cerebellum. Electronically Signed   By: Jeannine Boga M.D.   On: 05/14/2022 00:03   CT Angio Chest PE W/Cm &/Or Wo Cm  Result Date: 05/13/2022 CLINICAL DATA:  Chest pain, shortness of breath EXAM: CT ANGIOGRAPHY CHEST WITH CONTRAST TECHNIQUE: Multidetector CT imaging of the chest was performed using the standard protocol during bolus administration of intravenous contrast. Multiplanar CT image reconstructions and MIPs were obtained to evaluate the vascular anatomy. RADIATION DOSE REDUCTION: This exam was performed according to the departmental dose-optimization program which includes automated exposure control, adjustment of the mA and/or kV according to patient size and/or use of iterative reconstruction technique. CONTRAST:  61m OMNIPAQUE IOHEXOL 350 MG/ML SOLN COMPARISON:  Chest x-ray 09/01/2014 FINDINGS: Cardiovascular: Satisfactory opacification of the pulmonary arteries to the segmental level. No evidence of pulmonary embolism. Thoracic aorta is nonaneurysmal. Atherosclerotic calcification of the aorta and coronary arteries. Normal heart size. No pericardial effusion. Mediastinum/Nodes: No enlarged mediastinal, hilar, or axillary lymph nodes. Thyroid gland, trachea, and esophagus demonstrate no significant findings. Small-moderate-sized hiatal hernia. Lungs/Pleura: 5 mm noncalcified pulmonary nodule at the left lung apex (series 7, image 13). There are a few additional tiny 2-3 mm scattered pulmonary nodules. Lungs are otherwise clear. No pleural effusion or pneumothorax. Upper Abdomen: No acute abnormality. Musculoskeletal: No chest wall abnormality. No acute or significant osseous  findings. Review of the MIP images confirms the above findings. IMPRESSION: 1. No acute pulmonary embolism or other acute intrathoracic findings. 2. Small-moderate-sized hiatal hernia. 3. There are a few scattered pulmonary nodules, largest measuring up to 5 mm at the left lung apex. No follow-up needed if patient is low-risk (and has no known or suspected primary neoplasm). Non-contrast chest CT can be considered in 12 months if patient is high-risk. This recommendation follows the consensus statement: Guidelines for Management of Incidental Pulmonary Nodules Detected on CT Images: From the Fleischner Society 2017; Radiology 2017; 284:228-243. 4. Aortic and coronary artery atherosclerosis (ICD10-I70.0). Electronically Signed   By: NDavina PokeD.O.   On: 05/13/2022 14:54    ECG & Cardiac Imaging    RSR, 77, poor R progression - ? Prior anteroseptal infarct - personally reviewed.  Telemetry-sinus bradycardia to sinus rhythm with rates in the 50s to 60s.  First-degree AV block (250 ms).  Assessment & Plan    1.  Presyncope: Patient with a several year history of presyncope occurring up to 3 times per month, either while sitting or standing, without identifiable triggers, associated with lightheadedness followed by diaphoresis, nausea blackening of visual fields.  She has fallen on several occasions but notes that she has never actually lost consciousness.  She did wear a Holter monitor x48 hours earlier this month, which did not show any acute findings however, she also did not have any episodes during the monitoring period.  She presented to the emergency department on June 15 following 2 episodes that occurred on June 14.  Here, ECG shows sinus rhythm at 77 bpm with poor R wave progression.  Review of telemetry shows sinus rhythm and sinus bradycardia with first-degree AV block.  No prolonged pauses or high-grade heart block noted.  Symptoms concerning for possible conduction disease versus vasovagal  syncope.  Lack of identifiable triggers is concerning for conduction disease.  If no significant conduction disease noted while here, she will require a 2-week ZIO monitor at discharge.  Echocardiogram pending.  Avoid AV nodal blocking agents.  No driving.  Discussed the importance of lying flat  as soon as symptoms present.  2.  Exertional chest tightness: Patient with a several year history of progressive dyspnea on exertion and she notes occasional tightness in her chest while exerting.  The symptoms last just a few minutes and resolve with rest.  She notes that she had a stress test many years ago but has not been evaluated recently.  She is currently n.p.o.  Troponins are normal and ECG is without acute ST or T changes.  CT of the chest did show aortic and coronary atherosclerosis.  We will arrange for a Lexiscan Myoview this morning.  Continue aspirin and statin.  3.  Essential hypertension: Blood pressure mildly elevated.  Continue ACE inhibitor and diuretic therapy.  4.  Hyperlipidemia: LDL of 61 in December 2022.  Continue statin therapy.  5.  Hypomagnesemia: 1.3 on arrival and 1.4 following supplementation.  Additional supplementation ordered for this morning.  6.  Type 2 diabetes mellitus: Insulin management per primary team.  7.  Normocytic anemia: Stable.  Signed, Murray Hodgkins, NP 05/14/2022, 8:44 AM  For questions or updates, please contact   Please consult [email or web address] for contact info under Cardiology/STEMI.

## 2022-05-14 NOTE — ED Notes (Signed)
Pt taken to Nuclear Medicine.

## 2022-05-18 DIAGNOSIS — R55 Syncope and collapse: Secondary | ICD-10-CM | POA: Diagnosis not present

## 2022-06-07 DIAGNOSIS — D510 Vitamin B12 deficiency anemia due to intrinsic factor deficiency: Secondary | ICD-10-CM | POA: Diagnosis not present

## 2022-06-07 NOTE — Progress Notes (Unsigned)
Cardiology Office Note:    Date:  06/08/2022   ID:  Laura Mcdaniel, DOB [birth year], MRN [id number]  PCP:  Marinda Elk, MD  [facility] Cardiologist:  Kathlyn Sacramento, MD  [place] Electrophysiologist:  None   Referring MD: Marinda Elk, MD   Chief Complaint: Hospital follow-up  History of Present Illness:    Laura Mcdaniel is a 76 y.o. female with a hx of GERD, HTN, HLD, neuropathy, non-insulin dependent DM, who presents for hospital follow-up.   She was hospitalized for near syncope. Echo showed LVEF 60-65%, no WMA, G1DD, normal RV function, mild MD. Leane Call 04/2022 showed no evidence of ischemia, normal LV, low risk. CT attenuation images with mild aortic and coronary calcifications. Telemetry was unremarkable. She was sent home with a heart monitor. This showed normal rhythm, 9 runs of SVT, longest 9 beats, rare ectopy.  Today, the patient reports she still has occasional dizziness and lightheadedness. She feels swimmy-headed in the morning. She feels the worse in the morning. Nothing like the episode before the hospitalization. It can get better. She feels like she has Vertigo. She has had work-up for Vertigo in the past, she was possibly on Meclizine. Orthostatics negative today.   Past Medical History:  Diagnosis Date   Anemia    B12 deficiency    Carpal tunnel syndrome    Diabetes mellitus without complication (HCC)    Diarrhea    Difficult intubation    DJD (degenerative joint disease)    Enlarged RV (right ventricle)    Enlarged thyroid gland 1970's   GERD (gastroesophageal reflux disease)    Hearing loss    Hiatal hernia    Hypertension    IBS (irritable bowel syndrome)    Macular degeneration    Mixed hyperlipidemia    Peripheral neuropathy    Pre-syncope    a. 04/2022 nl Holter.   Stomatitis    Type 2 diabetes mellitus ([place])    Valvular heart disease    a. 08/2018 Ehco: Nl LV fxn. Triv AI, mld MR/TR/PR.    Past Surgical  History:  Procedure Laterality Date   ABDOMINAL HYSTERECTOMY     APPENDECTOMY     carpel tunnel Bilateral    CATARACT EXTRACTION Bilateral    COLONOSCOPY WITH PROPOFOL N/A 12/23/2017   Procedure: COLONOSCOPY WITH PROPOFOL;  Surgeon: Manya Silvas, MD;  Location: [facility] ENDOSCOPY;  Service: Endoscopy;  Laterality: N/A;   EYE SURGERY     bilateral   HAMMER TOE SURGERY Left 01/06/2018   Procedure: HAMMER TOE CORRECTION-2ND & 3RD TOES;  Surgeon: Albertine Patricia, DPM;  Location: [facility] ORS;  Service: Podiatry;  Laterality: Left;   JOINT REPLACEMENT     left knee2/9/09 right knee 2017   KNEE ARTHROPLASTY Right 01/19/2016   Procedure: COMPUTER ASSISTED TOTAL KNEE ARTHROPLASTY;  Surgeon: Dereck Leep, MD;  Location: [facility] ORS;  Service: Orthopedics;  Laterality: Right;   left knee surgery x2     Left total knee arthroplasty  2009   Dr. Marry Guan   SHOULDER SURGERY Left    THYROIDECTOMY     TONSILLECTOMY     WEIL OSTEOTOMY Left 01/06/2018   Procedure: WEIL OSTEOTOMY-2ND METATARSAL;  Surgeon: Albertine Patricia, DPM;  Location: [facility] ORS;  Service: Podiatry;  Laterality: Left;    Current Medications: Current Meds  Medication Sig   Aflibercept (EYLEA IO) Inject 1 Syringe into the eye as directed. Per ophthamology Office for macular degeneration   ASPIRIN EC PO Take 325 mg  by mouth at bedtime.   atorvastatin (LIPITOR) 40 MG tablet Take 40 mg by mouth at bedtime.    Calcium Carb-Cholecalciferol (CALCIUM 600+D3 PO) Take 1 tablet by mouth 2 (two) times daily.   fluticasone (FLONASE) 50 MCG/ACT nasal spray Place 1-2 sprays into the nose daily as needed. For allergies.   gabapentin (NEURONTIN) 300 MG capsule Take 300 mg by mouth as directed. 1 tablet in the morning and 2 tablets in evening   glimepiride (AMARYL) 1 MG tablet Take 1 mg by mouth 2 (two) times daily.    hydrochlorothiazide (HYDRODIURIL) 25 MG tablet Take 0.5 tablet (12.5 mg) by mouth once daily   lisinopril (PRINIVIL,ZESTRIL) 10 MG tablet Take  10 mg by mouth daily.    magnesium oxide (MAG-OX) 400 (240 Mg) MG tablet Take 400 mg by mouth daily.   metFORMIN (GLUCOPHAGE) 1000 MG tablet Take 1,000 mg by mouth 2 (two) times daily.   Multiple Vitamin (MULTIVITAMIN WITH MINERALS) TABS tablet Take 1 tablet by mouth daily.   Multiple Vitamins-Minerals (PRESERVISION AREDS 2 PO) Take 1 tablet by mouth 2 (two) times daily.   omeprazole (PRILOSEC) 20 MG capsule Take 20 mg by mouth daily before breakfast. as needed   pantoprazole (PROTONIX) 40 MG tablet Take 40 mg by mouth 2 (two) times daily.   pioglitazone (ACTOS) 15 MG tablet Take 15 mg by mouth at bedtime.   vitamin C (ASCORBIC ACID) 500 MG tablet Take 500 mg by mouth daily.   vitamin E 400 UNIT capsule Take 400 Units by mouth daily.     Allergies:   Mobic [meloxicam] and Vicodin [hydrocodone-acetaminophen]   Social History   Socioeconomic History   Marital status: Married    Spouse name: Not on file   Number of children: Not on file   Years of education: Not on file   Highest education level: Not on file  Occupational History   Not on file  Tobacco Use   Smoking status: Never   Smokeless tobacco: Never  Vaping Use   Vaping Use: Never used  Substance and Sexual Activity   Alcohol use: No   Drug use: No   Sexual activity: Not on file  Other Topics Concern   Not on file  Social History Narrative   Lives locally in [place] with her husband.  She is not particularly active.  Children live nearby.   Social Determinants of Health   Financial Resource Strain: Not on file  Food Insecurity: Not on file  Transportation Needs: Not on file  Physical Activity: Not on file  Stress: Not on file  Social Connections: Not on file     Family History: The patient's family history includes Cancer in her mother; Diabetes in her father. There is no history of Breast cancer.  ROS:   Please see the history of present illness.     All other systems reviewed and are  negative.  EKGs/Labs/Other Studies Reviewed:    The following studies were reviewed today:  Heart monitor 06/04/22 Patient had a min HR of 54 bpm, max HR of 162 bpm, and avg HR of 75 bpm. Predominant underlying rhythm was Sinus Rhythm. 9 Supraventricular Tachycardia runs occurred, the run with the fastest interval lasting 4 beats with a max rate of 162 bpm, the  longest lasting 9 beats with an avg rate of 99 bpm. Isolated SVEs were rare (<1.0%), SVE Couplets were rare (<1.0%), and SVE Triplets were rare (<1.0%). Isolated VEs were rare (<1.0%), VE Couplets were rare (<1.0%),  and no VE Triplets were present.   Echo 04/2022  1. Left ventricular ejection fraction, by estimation, is 60 to 65%. The  left ventricle has normal function. The left ventricle has no regional  wall motion abnormalities. Left ventricular diastolic parameters are  consistent with Grade I diastolic  dysfunction (impaired relaxation).   2. Right ventricular systolic function is normal. The right ventricular  size is normal. Tricuspid regurgitation signal is inadequate for assessing  PA pressure.   3. The mitral valve is normal in structure. Mild mitral valve  regurgitation. No evidence of mitral stenosis.   4. The aortic valve is normal in structure. Aortic valve regurgitation is  not visualized. Aortic valve sclerosis is present, with no evidence of  aortic valve stenosis.   5. The inferior vena cava is normal in size with greater than 50%  respiratory variability, suggesting right atrial pressure of 3 mmHg.   Myoview Lexiscan 04/2022   The study is normal. The study is low risk.   No ST deviation was noted.   LV perfusion is normal. There is no evidence of ischemia. There is no evidence of infarction.   Left ventricular function is normal. End diastolic cavity size is normal. End systolic cavity size is normal.   CT attenuation images with mild aortic and coronary calcifications  EKG:  EKG is  ordered today.  The ekg  ordered today demonstrates NSR, 76bpm, low voltage, nonspecific T wave changes  Recent Labs: 05/13/2022: B Natriuretic Peptide 51.3; TSH 3.209 05/14/2022: BUN 18; Creatinine, Ser 0.55; Hemoglobin 10.2; Magnesium 1.4; Platelets 331; Potassium 3.8; Sodium 139  Recent Lipid Panel No results found for: "CHOL", "TRIG", "HDL", "CHOLHDL", "VLDL", "LDLCALC", "LDLDIRECT"   Physical Exam:    VS:  BP 139/79 (BP Location: Left Arm, Cuff Size: Large)   Pulse 78   Ht '5\' 2"'$  (1.575 m)   Wt 181 lb 12.8 oz (82.5 kg)   SpO2 98%   BMI 33.25 kg/m     Wt Readings from Last 3 Encounters:  06/08/22 181 lb 12.8 oz (82.5 kg)  05/13/22 179 lb (81.2 kg)  01/06/18 173 lb (78.5 kg)     GEN:  Well nourished, well developed in no acute distress HEENT: Normal NECK: No JVD; No carotid bruits LYMPHATICS: No lymphadenopathy CARDIAC: RRR, no murmurs, rubs, gallops RESPIRATORY:  Clear to auscultation without rales, wheezing or rhonchi  ABDOMEN: Soft, non-tender, non-distended MUSCULOSKELETAL:  No edema; No deformity  SKIN: Warm and dry NEUROLOGIC:  Alert and oriented x 3 PSYCHIATRIC:  Normal affect   ASSESSMENT:    1. Near syncope   2. Dizziness   3. Atypical chest pain   4. Essential hypertension   5. Hyperlipidemia, mixed    PLAN:    In order of problems listed above:  Presyncope Work-up in the hospital with no clear etiology. Echo showed normal LVEF. Heart monitor was unremarkable. She continues to have peisdoes of dizziness and lightheadedness when she changes head position. She has h/o Vertigo and has undergone prior work-up for this. BP today is good with negative orthostatics. We will try decreasing HCTZ to 12.'5mg'$  daily and see if symptoms improve. If they do not, can go back up to '25mg'$  daily. I will check b/l US of the carotids.  Chest tightness She has occasional chest tightness related to stress. She can feel her heart pounding at this time. No shortness of breath. Recent Myoview was low risk  with no ischemia. It did show coronary artery calcifications. No further  ischemic work-up indicated. Continue Aspirin and statin.   HTN BP good today. Continue HCTZ and Lisinopril.   HLD LDL 61 in 2022. Continue Lipitor '40mg'$  daily.   DM2 Most recent A1C 7.1. This is followed by PCP.   Disposition: Follow up in 3 month(s) with MD/APP      Signed, Layla Kesling Ninfa Meeker, PA-C  06/08/2022 3:37 PM    [facility]

## 2022-06-08 ENCOUNTER — Ambulatory Visit: Payer: PPO | Admitting: Medical

## 2022-06-08 ENCOUNTER — Encounter: Payer: Self-pay | Admitting: Medical

## 2022-06-08 VITALS — BP 139/79 | HR 78 | Ht 62.0 in | Wt 181.8 lb

## 2022-06-08 DIAGNOSIS — R55 Syncope and collapse: Secondary | ICD-10-CM

## 2022-06-08 DIAGNOSIS — R42 Dizziness and giddiness: Secondary | ICD-10-CM

## 2022-06-08 DIAGNOSIS — R0789 Other chest pain: Secondary | ICD-10-CM

## 2022-06-08 DIAGNOSIS — E782 Mixed hyperlipidemia: Secondary | ICD-10-CM

## 2022-06-08 DIAGNOSIS — I10 Essential (primary) hypertension: Secondary | ICD-10-CM | POA: Diagnosis not present

## 2022-06-08 NOTE — Patient Instructions (Addendum)
Medication Instructions:  - Your physician has recommended you make the following change in your medication:   1) DECREASE HCTZ 25 mg: - take 0.5 tablet (12.5 mg) by mouth once daily  Try this decrease in the HCTZ for 2 weeks, then call the office/ send a MyChart message and let us know how you are feeling (better/ no change)  *If you need a refill on your cardiac medications before your next appointment, please call your pharmacy*   Lab Work: - none ordered  If you have labs (blood work) drawn today and your tests are completely normal, you will receive your results only by: [place] (if you have MyChart) OR A paper copy in the mail If you have any lab test that is abnormal or we need to change your treatment, we will call you to review the results.   Testing/Procedures:  1) Carotid Ultrasound: - Your physician has requested that you have a carotid duplex. This test is an ultrasound of the carotid arteries in your neck. It looks at blood flow through these arteries that supply the brain with blood. Allow one hour for this exam. There are no restrictions or special instructions.    Follow-Up: At [facility], you and your health needs are our priority.  As part of our continuing mission to provide you with exceptional heart care, we have created designated Provider Care Teams.  These Care Teams include your primary Cardiologist (physician) and Advanced Practice Providers (APPs -  Physician Assistants and Nurse Practitioners) who all work together to provide you with the care you need, when you need it.  We recommend signing up for the patient portal called "MyChart".  Sign up information is provided on this After Visit Summary.  MyChart is used to connect with patients for Virtual Visits (Telemedicine).  Patients are able to view lab/test results, encounter notes, upcoming appointments, etc.  Non-urgent messages can be sent to your provider as well.   To learn more about what  you can do with MyChart, go to [email or web address].    Your next appointment:   3 month(s)  The format for your next appointment:   In Person  Provider:   You may see Kathlyn Sacramento, MD or one of the following Advanced Practice Providers on your designated Care Team:    Cadence Kathlen Mody, [place]    Other Instructions N/a  Important Information About Sugar

## 2022-06-09 DIAGNOSIS — H43813 Vitreous degeneration, bilateral: Secondary | ICD-10-CM | POA: Diagnosis not present

## 2022-06-09 DIAGNOSIS — H353122 Nonexudative age-related macular degeneration, left eye, intermediate dry stage: Secondary | ICD-10-CM | POA: Diagnosis not present

## 2022-06-09 DIAGNOSIS — H43393 Other vitreous opacities, bilateral: Secondary | ICD-10-CM | POA: Diagnosis not present

## 2022-06-09 DIAGNOSIS — H353212 Exudative age-related macular degeneration, right eye, with inactive choroidal neovascularization: Secondary | ICD-10-CM | POA: Diagnosis not present

## 2022-06-14 DIAGNOSIS — Z794 Long term (current) use of insulin: Secondary | ICD-10-CM | POA: Diagnosis not present

## 2022-06-14 DIAGNOSIS — E1140 Type 2 diabetes mellitus with diabetic neuropathy, unspecified: Secondary | ICD-10-CM | POA: Diagnosis not present

## 2022-06-15 DIAGNOSIS — M9905 Segmental and somatic dysfunction of pelvic region: Secondary | ICD-10-CM | POA: Diagnosis not present

## 2022-06-15 DIAGNOSIS — M9902 Segmental and somatic dysfunction of thoracic region: Secondary | ICD-10-CM | POA: Diagnosis not present

## 2022-06-15 DIAGNOSIS — M9901 Segmental and somatic dysfunction of cervical region: Secondary | ICD-10-CM | POA: Diagnosis not present

## 2022-06-15 DIAGNOSIS — M9904 Segmental and somatic dysfunction of sacral region: Secondary | ICD-10-CM | POA: Diagnosis not present

## 2022-06-17 DIAGNOSIS — M9905 Segmental and somatic dysfunction of pelvic region: Secondary | ICD-10-CM | POA: Diagnosis not present

## 2022-06-17 DIAGNOSIS — M9902 Segmental and somatic dysfunction of thoracic region: Secondary | ICD-10-CM | POA: Diagnosis not present

## 2022-06-17 DIAGNOSIS — M9901 Segmental and somatic dysfunction of cervical region: Secondary | ICD-10-CM | POA: Diagnosis not present

## 2022-06-17 DIAGNOSIS — M9904 Segmental and somatic dysfunction of sacral region: Secondary | ICD-10-CM | POA: Diagnosis not present

## 2022-06-21 DIAGNOSIS — E1169 Type 2 diabetes mellitus with other specified complication: Secondary | ICD-10-CM | POA: Diagnosis not present

## 2022-06-21 DIAGNOSIS — Z794 Long term (current) use of insulin: Secondary | ICD-10-CM | POA: Diagnosis not present

## 2022-06-21 DIAGNOSIS — E1159 Type 2 diabetes mellitus with other circulatory complications: Secondary | ICD-10-CM | POA: Diagnosis not present

## 2022-06-21 DIAGNOSIS — E1140 Type 2 diabetes mellitus with diabetic neuropathy, unspecified: Secondary | ICD-10-CM | POA: Diagnosis not present

## 2022-06-21 DIAGNOSIS — I152 Hypertension secondary to endocrine disorders: Secondary | ICD-10-CM | POA: Diagnosis not present

## 2022-06-21 DIAGNOSIS — E785 Hyperlipidemia, unspecified: Secondary | ICD-10-CM | POA: Diagnosis not present

## 2022-06-22 DIAGNOSIS — E113393 Type 2 diabetes mellitus with moderate nonproliferative diabetic retinopathy without macular edema, bilateral: Secondary | ICD-10-CM | POA: Diagnosis not present

## 2022-06-22 DIAGNOSIS — M9902 Segmental and somatic dysfunction of thoracic region: Secondary | ICD-10-CM | POA: Diagnosis not present

## 2022-06-22 DIAGNOSIS — M9901 Segmental and somatic dysfunction of cervical region: Secondary | ICD-10-CM | POA: Diagnosis not present

## 2022-06-22 DIAGNOSIS — M9904 Segmental and somatic dysfunction of sacral region: Secondary | ICD-10-CM | POA: Diagnosis not present

## 2022-06-22 DIAGNOSIS — M9905 Segmental and somatic dysfunction of pelvic region: Secondary | ICD-10-CM | POA: Diagnosis not present

## 2022-06-24 DIAGNOSIS — M9904 Segmental and somatic dysfunction of sacral region: Secondary | ICD-10-CM | POA: Diagnosis not present

## 2022-06-24 DIAGNOSIS — M9905 Segmental and somatic dysfunction of pelvic region: Secondary | ICD-10-CM | POA: Diagnosis not present

## 2022-06-24 DIAGNOSIS — M9902 Segmental and somatic dysfunction of thoracic region: Secondary | ICD-10-CM | POA: Diagnosis not present

## 2022-06-24 DIAGNOSIS — M9901 Segmental and somatic dysfunction of cervical region: Secondary | ICD-10-CM | POA: Diagnosis not present

## 2022-06-29 DIAGNOSIS — M9905 Segmental and somatic dysfunction of pelvic region: Secondary | ICD-10-CM | POA: Diagnosis not present

## 2022-06-29 DIAGNOSIS — M9904 Segmental and somatic dysfunction of sacral region: Secondary | ICD-10-CM | POA: Diagnosis not present

## 2022-06-29 DIAGNOSIS — M9902 Segmental and somatic dysfunction of thoracic region: Secondary | ICD-10-CM | POA: Diagnosis not present

## 2022-06-29 DIAGNOSIS — M9901 Segmental and somatic dysfunction of cervical region: Secondary | ICD-10-CM | POA: Diagnosis not present

## 2022-07-01 DIAGNOSIS — M9904 Segmental and somatic dysfunction of sacral region: Secondary | ICD-10-CM | POA: Diagnosis not present

## 2022-07-01 DIAGNOSIS — M9902 Segmental and somatic dysfunction of thoracic region: Secondary | ICD-10-CM | POA: Diagnosis not present

## 2022-07-01 DIAGNOSIS — M9905 Segmental and somatic dysfunction of pelvic region: Secondary | ICD-10-CM | POA: Diagnosis not present

## 2022-07-01 DIAGNOSIS — M9901 Segmental and somatic dysfunction of cervical region: Secondary | ICD-10-CM | POA: Diagnosis not present

## 2022-07-27 DIAGNOSIS — M9905 Segmental and somatic dysfunction of pelvic region: Secondary | ICD-10-CM | POA: Diagnosis not present

## 2022-07-27 DIAGNOSIS — M9902 Segmental and somatic dysfunction of thoracic region: Secondary | ICD-10-CM | POA: Diagnosis not present

## 2022-07-27 DIAGNOSIS — D510 Vitamin B12 deficiency anemia due to intrinsic factor deficiency: Secondary | ICD-10-CM | POA: Diagnosis not present

## 2022-07-27 DIAGNOSIS — M9901 Segmental and somatic dysfunction of cervical region: Secondary | ICD-10-CM | POA: Diagnosis not present

## 2022-07-27 DIAGNOSIS — M9904 Segmental and somatic dysfunction of sacral region: Secondary | ICD-10-CM | POA: Diagnosis not present

## 2022-07-27 DIAGNOSIS — E538 Deficiency of other specified B group vitamins: Secondary | ICD-10-CM | POA: Diagnosis not present

## 2022-07-29 DIAGNOSIS — M9904 Segmental and somatic dysfunction of sacral region: Secondary | ICD-10-CM | POA: Diagnosis not present

## 2022-07-29 DIAGNOSIS — M9901 Segmental and somatic dysfunction of cervical region: Secondary | ICD-10-CM | POA: Diagnosis not present

## 2022-07-29 DIAGNOSIS — M9905 Segmental and somatic dysfunction of pelvic region: Secondary | ICD-10-CM | POA: Diagnosis not present

## 2022-07-29 DIAGNOSIS — M9902 Segmental and somatic dysfunction of thoracic region: Secondary | ICD-10-CM | POA: Diagnosis not present

## 2022-08-03 DIAGNOSIS — M9902 Segmental and somatic dysfunction of thoracic region: Secondary | ICD-10-CM | POA: Diagnosis not present

## 2022-08-03 DIAGNOSIS — M9904 Segmental and somatic dysfunction of sacral region: Secondary | ICD-10-CM | POA: Diagnosis not present

## 2022-08-03 DIAGNOSIS — M9905 Segmental and somatic dysfunction of pelvic region: Secondary | ICD-10-CM | POA: Diagnosis not present

## 2022-08-03 DIAGNOSIS — M9901 Segmental and somatic dysfunction of cervical region: Secondary | ICD-10-CM | POA: Diagnosis not present

## 2022-08-05 DIAGNOSIS — M9904 Segmental and somatic dysfunction of sacral region: Secondary | ICD-10-CM | POA: Diagnosis not present

## 2022-08-05 DIAGNOSIS — M9901 Segmental and somatic dysfunction of cervical region: Secondary | ICD-10-CM | POA: Diagnosis not present

## 2022-08-05 DIAGNOSIS — M9905 Segmental and somatic dysfunction of pelvic region: Secondary | ICD-10-CM | POA: Diagnosis not present

## 2022-08-05 DIAGNOSIS — M9902 Segmental and somatic dysfunction of thoracic region: Secondary | ICD-10-CM | POA: Diagnosis not present

## 2022-08-10 DIAGNOSIS — M9901 Segmental and somatic dysfunction of cervical region: Secondary | ICD-10-CM | POA: Diagnosis not present

## 2022-08-10 DIAGNOSIS — M9904 Segmental and somatic dysfunction of sacral region: Secondary | ICD-10-CM | POA: Diagnosis not present

## 2022-08-10 DIAGNOSIS — M9902 Segmental and somatic dysfunction of thoracic region: Secondary | ICD-10-CM | POA: Diagnosis not present

## 2022-08-10 DIAGNOSIS — M9905 Segmental and somatic dysfunction of pelvic region: Secondary | ICD-10-CM | POA: Diagnosis not present

## 2022-08-11 DIAGNOSIS — E1169 Type 2 diabetes mellitus with other specified complication: Secondary | ICD-10-CM | POA: Diagnosis not present

## 2022-08-11 DIAGNOSIS — E1165 Type 2 diabetes mellitus with hyperglycemia: Secondary | ICD-10-CM | POA: Diagnosis not present

## 2022-08-11 DIAGNOSIS — E785 Hyperlipidemia, unspecified: Secondary | ICD-10-CM | POA: Diagnosis not present

## 2022-08-11 DIAGNOSIS — D649 Anemia, unspecified: Secondary | ICD-10-CM | POA: Diagnosis not present

## 2022-08-12 DIAGNOSIS — M9901 Segmental and somatic dysfunction of cervical region: Secondary | ICD-10-CM | POA: Diagnosis not present

## 2022-08-12 DIAGNOSIS — M9905 Segmental and somatic dysfunction of pelvic region: Secondary | ICD-10-CM | POA: Diagnosis not present

## 2022-08-12 DIAGNOSIS — M9902 Segmental and somatic dysfunction of thoracic region: Secondary | ICD-10-CM | POA: Diagnosis not present

## 2022-08-12 DIAGNOSIS — M9904 Segmental and somatic dysfunction of sacral region: Secondary | ICD-10-CM | POA: Diagnosis not present

## 2022-08-13 ENCOUNTER — Ambulatory Visit: Payer: PPO | Attending: Medical

## 2022-08-13 DIAGNOSIS — R42 Dizziness and giddiness: Secondary | ICD-10-CM

## 2022-08-13 DIAGNOSIS — R55 Syncope and collapse: Secondary | ICD-10-CM

## 2022-08-16 DIAGNOSIS — E669 Obesity, unspecified: Secondary | ICD-10-CM | POA: Diagnosis not present

## 2022-08-16 DIAGNOSIS — E785 Hyperlipidemia, unspecified: Secondary | ICD-10-CM | POA: Diagnosis not present

## 2022-08-16 DIAGNOSIS — E1342 Other specified diabetes mellitus with diabetic polyneuropathy: Secondary | ICD-10-CM | POA: Diagnosis not present

## 2022-08-16 DIAGNOSIS — E1159 Type 2 diabetes mellitus with other circulatory complications: Secondary | ICD-10-CM | POA: Diagnosis not present

## 2022-08-16 DIAGNOSIS — D649 Anemia, unspecified: Secondary | ICD-10-CM | POA: Diagnosis not present

## 2022-08-16 DIAGNOSIS — E1165 Type 2 diabetes mellitus with hyperglycemia: Secondary | ICD-10-CM | POA: Diagnosis not present

## 2022-08-16 DIAGNOSIS — I152 Hypertension secondary to endocrine disorders: Secondary | ICD-10-CM | POA: Diagnosis not present

## 2022-08-16 DIAGNOSIS — E1169 Type 2 diabetes mellitus with other specified complication: Secondary | ICD-10-CM | POA: Diagnosis not present

## 2022-08-16 DIAGNOSIS — Z23 Encounter for immunization: Secondary | ICD-10-CM | POA: Diagnosis not present

## 2022-08-16 DIAGNOSIS — R790 Abnormal level of blood mineral: Secondary | ICD-10-CM | POA: Diagnosis not present

## 2022-08-17 DIAGNOSIS — M9905 Segmental and somatic dysfunction of pelvic region: Secondary | ICD-10-CM | POA: Diagnosis not present

## 2022-08-17 DIAGNOSIS — M9901 Segmental and somatic dysfunction of cervical region: Secondary | ICD-10-CM | POA: Diagnosis not present

## 2022-08-17 DIAGNOSIS — M9904 Segmental and somatic dysfunction of sacral region: Secondary | ICD-10-CM | POA: Diagnosis not present

## 2022-08-17 DIAGNOSIS — M9902 Segmental and somatic dysfunction of thoracic region: Secondary | ICD-10-CM | POA: Diagnosis not present

## 2022-08-24 DIAGNOSIS — M9902 Segmental and somatic dysfunction of thoracic region: Secondary | ICD-10-CM | POA: Diagnosis not present

## 2022-08-24 DIAGNOSIS — M9905 Segmental and somatic dysfunction of pelvic region: Secondary | ICD-10-CM | POA: Diagnosis not present

## 2022-08-24 DIAGNOSIS — M9904 Segmental and somatic dysfunction of sacral region: Secondary | ICD-10-CM | POA: Diagnosis not present

## 2022-08-24 DIAGNOSIS — M9901 Segmental and somatic dysfunction of cervical region: Secondary | ICD-10-CM | POA: Diagnosis not present

## 2022-08-25 DIAGNOSIS — L603 Nail dystrophy: Secondary | ICD-10-CM | POA: Diagnosis not present

## 2022-08-25 DIAGNOSIS — E1142 Type 2 diabetes mellitus with diabetic polyneuropathy: Secondary | ICD-10-CM | POA: Diagnosis not present

## 2022-08-30 DIAGNOSIS — D510 Vitamin B12 deficiency anemia due to intrinsic factor deficiency: Secondary | ICD-10-CM | POA: Diagnosis not present

## 2022-08-31 DIAGNOSIS — M9904 Segmental and somatic dysfunction of sacral region: Secondary | ICD-10-CM | POA: Diagnosis not present

## 2022-08-31 DIAGNOSIS — M9902 Segmental and somatic dysfunction of thoracic region: Secondary | ICD-10-CM | POA: Diagnosis not present

## 2022-08-31 DIAGNOSIS — M9901 Segmental and somatic dysfunction of cervical region: Secondary | ICD-10-CM | POA: Diagnosis not present

## 2022-08-31 DIAGNOSIS — M9905 Segmental and somatic dysfunction of pelvic region: Secondary | ICD-10-CM | POA: Diagnosis not present

## 2022-09-07 DIAGNOSIS — M9904 Segmental and somatic dysfunction of sacral region: Secondary | ICD-10-CM | POA: Diagnosis not present

## 2022-09-07 DIAGNOSIS — M9902 Segmental and somatic dysfunction of thoracic region: Secondary | ICD-10-CM | POA: Diagnosis not present

## 2022-09-07 DIAGNOSIS — M9905 Segmental and somatic dysfunction of pelvic region: Secondary | ICD-10-CM | POA: Diagnosis not present

## 2022-09-07 DIAGNOSIS — M9901 Segmental and somatic dysfunction of cervical region: Secondary | ICD-10-CM | POA: Diagnosis not present

## 2022-09-08 DIAGNOSIS — H43813 Vitreous degeneration, bilateral: Secondary | ICD-10-CM | POA: Diagnosis not present

## 2022-09-08 DIAGNOSIS — H353122 Nonexudative age-related macular degeneration, left eye, intermediate dry stage: Secondary | ICD-10-CM | POA: Diagnosis not present

## 2022-09-08 DIAGNOSIS — H353212 Exudative age-related macular degeneration, right eye, with inactive choroidal neovascularization: Secondary | ICD-10-CM | POA: Diagnosis not present

## 2022-09-14 DIAGNOSIS — M9902 Segmental and somatic dysfunction of thoracic region: Secondary | ICD-10-CM | POA: Diagnosis not present

## 2022-09-14 DIAGNOSIS — M9905 Segmental and somatic dysfunction of pelvic region: Secondary | ICD-10-CM | POA: Diagnosis not present

## 2022-09-14 DIAGNOSIS — M9901 Segmental and somatic dysfunction of cervical region: Secondary | ICD-10-CM | POA: Diagnosis not present

## 2022-09-14 DIAGNOSIS — M9904 Segmental and somatic dysfunction of sacral region: Secondary | ICD-10-CM | POA: Diagnosis not present

## 2022-09-17 ENCOUNTER — Ambulatory Visit: Payer: PPO | Attending: Medical | Admitting: Medical

## 2022-09-17 ENCOUNTER — Encounter: Payer: Self-pay | Admitting: Medical

## 2022-09-17 VITALS — BP 124/62 | HR 87 | Ht 62.0 in | Wt 185.6 lb

## 2022-09-17 DIAGNOSIS — R42 Dizziness and giddiness: Secondary | ICD-10-CM

## 2022-09-17 DIAGNOSIS — I10 Essential (primary) hypertension: Secondary | ICD-10-CM

## 2022-09-17 DIAGNOSIS — E782 Mixed hyperlipidemia: Secondary | ICD-10-CM

## 2022-09-17 NOTE — Patient Instructions (Signed)
Medication Instructions:  Your physician recommends that you continue on your current medications as directed. Please refer to the Current Medication list given to you today.   Labwork: None  Testing/Procedures: None  Follow-Up: Follow up with Cadence Furth PA-C in 1 year.   Any Other Special Instructions Will Be Listed Below (If Applicable).     If you need a refill on your cardiac medications before your next appointment, please call your pharmacy.

## 2022-09-17 NOTE — Progress Notes (Signed)
Cardiology Office Note:    Date:  09/17/2022   ID:  Laura Mcdaniel, DOB 1946-09-23, MRN [id number]  PCP:  Laura Elk, MD  [facility] Cardiologist:  Kathlyn Sacramento, MD  [facility] Electrophysiologist:  None   Referring MD: Laura Elk, MD   Chief Complaint: 3 month follow-up  History of Present Illness:    Laura Mcdaniel is a 76 y.o. female with a hx of GERD, HTN, HLD, neuropathy, non-insulin dependent DM, who presents for 3 month follow-up.    She was hospitalized 04/2022 for near syncope. Echo showed LVEF 60-65%, no WMA, G1DD, normal RV function, mild MD. Laura Mcdaniel 04/2022 showed no evidence of ischemia, normal LV, low risk. CT attenuation images with mild aortic and coronary calcifications. Telemetry was unremarkable. She was sent home with a heart monitor. This showed normal rhythm, 9 runs of SVT, longest 9 beats, rare ectopy.  Last seen 06/08/22 and reported occasional dizziness and lightheadedness. Orthostatics were negative. She reported history of Vertigo. HCTZ was decreased. B/L US of the Carotids was checked. These showed no obstructive disease.   Today, the patient reports she is overall doing well. She reports daily dizziness and lightheadedness that is faily controlled. She denies chest pain, shortness of breath, lower leg edema. She is not as active as before due to the symptoms. Doesn't go out walking any more. She tried stopping HCTZ with no improvement in symptoms.   Past Medical History:  Diagnosis Date   Anemia    B12 deficiency    Carpal tunnel syndrome    Diabetes mellitus without complication (HCC)    Diarrhea    Difficult intubation    DJD (degenerative joint disease)    Enlarged RV (right ventricle)    Enlarged thyroid gland 1970's   GERD (gastroesophageal reflux disease)    Hearing loss    Hiatal hernia    Hypertension    IBS (irritable bowel syndrome)    Macular degeneration    Mixed hyperlipidemia    Peripheral  neuropathy    Pre-syncope    a. 04/2022 nl Holter.   Stomatitis    Type 2 diabetes mellitus ([place])    Valvular heart disease    a. 08/2018 Ehco: Nl LV fxn. Triv AI, mld MR/TR/PR.    Past Surgical History:  Procedure Laterality Date   ABDOMINAL HYSTERECTOMY     APPENDECTOMY     carpel tunnel Bilateral    CATARACT EXTRACTION Bilateral    COLONOSCOPY WITH PROPOFOL N/A 12/23/2017   Procedure: COLONOSCOPY WITH PROPOFOL;  Surgeon: Laura Silvas, MD;  Location: [facility] ENDOSCOPY;  Service: Endoscopy;  Laterality: N/A;   EYE SURGERY     bilateral   HAMMER TOE SURGERY Left 01/06/2018   Procedure: HAMMER TOE CORRECTION-2ND & 3RD TOES;  Surgeon: Laura Mcdaniel, DPM;  Location: [facility] ORS;  Service: Podiatry;  Laterality: Left;   JOINT REPLACEMENT     left knee2/9/09 right knee 2017   KNEE ARTHROPLASTY Right 01/19/2016   Procedure: COMPUTER ASSISTED TOTAL KNEE ARTHROPLASTY;  Surgeon: Laura Leep, MD;  Location: [facility] ORS;  Service: Orthopedics;  Laterality: Right;   left knee surgery x2     Left total knee arthroplasty  2009   Dr. Marry Mcdaniel   SHOULDER SURGERY Left    THYROIDECTOMY     TONSILLECTOMY     WEIL OSTEOTOMY Left 01/06/2018   Procedure: WEIL OSTEOTOMY-2ND METATARSAL;  Surgeon: Laura Mcdaniel, DPM;  Location: [facility] ORS;  Service: Podiatry;  Laterality: Left;  Current Medications: Current Meds  Medication Sig   Aflibercept (EYLEA IO) Inject 1 Syringe into the eye as directed. Per ophthamology Office for macular degeneration   ASPIRIN EC PO Take 325 mg by mouth at bedtime.   atorvastatin (LIPITOR) 40 MG tablet Take 40 mg by mouth at bedtime.    Calcium Carb-Cholecalciferol (CALCIUM 600+D3 PO) Take 1 tablet by mouth 2 (two) times daily.   fluticasone (FLONASE) 50 MCG/ACT nasal spray Place 1-2 sprays into the nose daily as needed. For allergies.   gabapentin (NEURONTIN) 300 MG capsule Take 300 mg by mouth as directed. 1 tablet in the morning and 2 tablets in evening   glimepiride  (AMARYL) 1 MG tablet Take 1 mg by mouth 2 (two) times daily.    hydrochlorothiazide (HYDRODIURIL) 25 MG tablet Take 0.5 tablet (12.5 mg) by mouth once daily   insulin glargine (LANTUS SOLOSTAR) 100 UNIT/ML Solostar Pen Inject 8 Units into the skin at bedtime. Sometimes 6 units at night   lisinopril (PRINIVIL,ZESTRIL) 10 MG tablet Take 10 mg by mouth daily.    magnesium oxide (MAG-OX) 400 (240 Mg) MG tablet Take 400 mg by mouth daily.   metFORMIN (GLUCOPHAGE) 1000 MG tablet Take 1,000 mg by mouth 2 (two) times daily.   Multiple Vitamin (MULTIVITAMIN WITH MINERALS) TABS tablet Take 1 tablet by mouth daily.   Multiple Vitamins-Minerals (PRESERVISION AREDS 2 PO) Take 1 tablet by mouth 2 (two) times daily.   pantoprazole (PROTONIX) 40 MG tablet Take 40 mg by mouth 2 (two) times daily.   pioglitazone (ACTOS) 15 MG tablet Take 15 mg by mouth at bedtime.   vitamin C (ASCORBIC ACID) 500 MG tablet Take 500 mg by mouth daily.   vitamin E 400 UNIT capsule Take 400 Units by mouth daily.     Allergies:   Mobic [meloxicam] and Vicodin [hydrocodone-acetaminophen]   Social History   Socioeconomic History   Marital status: Married    Spouse name: Not on file   Number of children: Not on file   Years of education: Not on file   Highest education level: Not on file  Occupational History   Not on file  Tobacco Use   Smoking status: Never   Smokeless tobacco: Never  Vaping Use   Vaping Use: Never used  Substance and Sexual Activity   Alcohol use: No   Drug use: No   Sexual activity: Not on file  Other Topics Concern   Not on file  Social History Narrative   Lives locally in [place] with her husband.  She is not particularly active.  Children live nearby.   Social Determinants of Health   Financial Resource Strain: Not on file  Food Insecurity: Not on file  Transportation Needs: Not on file  Physical Activity: Not on file  Stress: Not on file  Social Connections: Not on file     Family  History: The patient's family history includes Cancer in her mother; Diabetes in her father. There is no history of Breast cancer.  ROS:   Please see the history of present illness.     All other systems reviewed and are negative.  EKGs/Labs/Other Studies Reviewed:    The following studies were reviewed today:  B/L US Carotids 07/2022 Right Carotid: There was no evidence of thrombus, dissection,  atherosclerotic                 plaque or stenosis in the cervical carotid system.   Left Carotid: There was no evidence of  thrombus, dissection,  atherosclerotic                plaque or stenosis in the cervical carotid system.   Vertebrals:  Bilateral vertebral arteries demonstrate antegrade flow.  Subclavians: Normal flow hemodynamics were seen in bilateral subclavian               arteries.    Heart monitor 06/04/22 Patient had a min HR of 54 bpm, max HR of 162 bpm, and avg HR of 75 bpm. Predominant underlying rhythm was Sinus Rhythm. 9 Supraventricular Tachycardia runs occurred, the run with the fastest interval lasting 4 beats with a max rate of 162 bpm, the  longest lasting 9 beats with an avg rate of 99 bpm. Isolated SVEs were rare (<1.0%), SVE Couplets were rare (<1.0%), and SVE Triplets were rare (<1.0%). Isolated VEs were rare (<1.0%), VE Couplets were rare (<1.0%), and no VE Triplets were present.    Echo 04/2022  1. Left ventricular ejection fraction, by estimation, is 60 to 65%. The  left ventricle has normal function. The left ventricle has no regional  wall motion abnormalities. Left ventricular diastolic parameters are  consistent with Grade I diastolic  dysfunction (impaired relaxation).   2. Right ventricular systolic function is normal. The right ventricular  size is normal. Tricuspid regurgitation signal is inadequate for assessing  PA pressure.   3. The mitral valve is normal in structure. Mild mitral valve  regurgitation. No evidence of mitral stenosis.   4. The  aortic valve is normal in structure. Aortic valve regurgitation is  not visualized. Aortic valve sclerosis is present, with no evidence of  aortic valve stenosis.   5. The inferior vena cava is normal in size with greater than 50%  respiratory variability, suggesting right atrial pressure of 3 mmHg.    Myoview Lexiscan 04/2022   The study is normal. The study is low risk.   No ST deviation was noted.   LV perfusion is normal. There is no evidence of ischemia. There is no evidence of infarction.   Left ventricular function is normal. End diastolic cavity size is normal. End systolic cavity size is normal.   CT attenuation images with mild aortic and coronary calcifications  EKG:  EKG is not ordered today.    Recent Labs: 05/13/2022: B Natriuretic Peptide 51.3; TSH 3.209 05/14/2022: BUN 18; Creatinine, Ser 0.55; Hemoglobin 10.2; Magnesium 1.4; Platelets 331; Potassium 3.8; Sodium 139  Recent Lipid Panel No results found for: "CHOL", "TRIG", "HDL", "CHOLHDL", "VLDL", "LDLCALC", "LDLDIRECT"   Physical Exam:    VS:  BP 124/62 (BP Location: Left Arm, Patient Position: Sitting, Cuff Size: Normal)   Pulse 87   Ht '5\' 2"'$  (1.575 m)   Wt 185 lb 9.6 oz (84.2 kg)   SpO2 97%   BMI 33.95 kg/m     Wt Readings from Last 3 Encounters:  09/17/22 185 lb 9.6 oz (84.2 kg)  06/08/22 181 lb 12.8 oz (82.5 kg)  05/13/22 179 lb (81.2 kg)     GEN:  Well nourished, well developed in no acute distress HEENT: Normal NECK: No JVD; No carotid bruits LYMPHATICS: No lymphadenopathy CARDIAC: RRR, no murmurs, rubs, gallops RESPIRATORY:  Clear to auscultation without rales, wheezing or rhonchi  ABDOMEN: Soft, non-tender, non-distended MUSCULOSKELETAL:  No edema; No deformity  SKIN: Warm and dry NEUROLOGIC:  Alert and oriented x 3 PSYCHIATRIC:  Normal affect   ASSESSMENT:    1. Dizziness   2. Essential hypertension   3. Hyperlipidemia,  mixed    PLAN:    In order of problems listed  above:  Dizziness/Lightheadedness She has persistent daily lightheadedness and dizziness. She tried stopping HCTZ with no change in symptoms. BP is good today. B/L US carotids showed nonobstructive disease. Prio echo showed normal LVEF and prior heart monitor was unremarkable. Orthostatics in the past have been negative. BP is good toda. Continue HCTZ 12.'5mg'$  daily, lisinopril '10mg'$  daily. No further cardiac work-up at this time.   HTN BP is good today, contiue HCTZ and Lisinopril as above.  HLD LDL 61 in 2022. Continue Lipitor '40mg'$  daily,   Disposition: Follow up in 1 year(s) with MD/APP    Signed, Kadey Mihalic Ninfa Meeker, PA-C  09/17/2022 10:45 AM    [place]

## 2022-09-21 DIAGNOSIS — M9905 Segmental and somatic dysfunction of pelvic region: Secondary | ICD-10-CM | POA: Diagnosis not present

## 2022-09-21 DIAGNOSIS — I152 Hypertension secondary to endocrine disorders: Secondary | ICD-10-CM | POA: Diagnosis not present

## 2022-09-21 DIAGNOSIS — M9902 Segmental and somatic dysfunction of thoracic region: Secondary | ICD-10-CM | POA: Diagnosis not present

## 2022-09-21 DIAGNOSIS — E1169 Type 2 diabetes mellitus with other specified complication: Secondary | ICD-10-CM | POA: Diagnosis not present

## 2022-09-21 DIAGNOSIS — M9904 Segmental and somatic dysfunction of sacral region: Secondary | ICD-10-CM | POA: Diagnosis not present

## 2022-09-21 DIAGNOSIS — E785 Hyperlipidemia, unspecified: Secondary | ICD-10-CM | POA: Diagnosis not present

## 2022-09-21 DIAGNOSIS — Z794 Long term (current) use of insulin: Secondary | ICD-10-CM | POA: Diagnosis not present

## 2022-09-21 DIAGNOSIS — E1159 Type 2 diabetes mellitus with other circulatory complications: Secondary | ICD-10-CM | POA: Diagnosis not present

## 2022-09-21 DIAGNOSIS — M9901 Segmental and somatic dysfunction of cervical region: Secondary | ICD-10-CM | POA: Diagnosis not present

## 2022-09-21 DIAGNOSIS — E1140 Type 2 diabetes mellitus with diabetic neuropathy, unspecified: Secondary | ICD-10-CM | POA: Diagnosis not present

## 2022-09-28 DIAGNOSIS — M9904 Segmental and somatic dysfunction of sacral region: Secondary | ICD-10-CM | POA: Diagnosis not present

## 2022-09-28 DIAGNOSIS — M9901 Segmental and somatic dysfunction of cervical region: Secondary | ICD-10-CM | POA: Diagnosis not present

## 2022-09-28 DIAGNOSIS — M9905 Segmental and somatic dysfunction of pelvic region: Secondary | ICD-10-CM | POA: Diagnosis not present

## 2022-09-28 DIAGNOSIS — M9902 Segmental and somatic dysfunction of thoracic region: Secondary | ICD-10-CM | POA: Diagnosis not present

## 2022-10-04 DIAGNOSIS — E538 Deficiency of other specified B group vitamins: Secondary | ICD-10-CM | POA: Diagnosis not present

## 2022-10-12 DIAGNOSIS — M9902 Segmental and somatic dysfunction of thoracic region: Secondary | ICD-10-CM | POA: Diagnosis not present

## 2022-10-12 DIAGNOSIS — M9901 Segmental and somatic dysfunction of cervical region: Secondary | ICD-10-CM | POA: Diagnosis not present

## 2022-10-12 DIAGNOSIS — M9905 Segmental and somatic dysfunction of pelvic region: Secondary | ICD-10-CM | POA: Diagnosis not present

## 2022-10-12 DIAGNOSIS — M9904 Segmental and somatic dysfunction of sacral region: Secondary | ICD-10-CM | POA: Diagnosis not present

## 2022-10-26 DIAGNOSIS — M9904 Segmental and somatic dysfunction of sacral region: Secondary | ICD-10-CM | POA: Diagnosis not present

## 2022-10-26 DIAGNOSIS — M9905 Segmental and somatic dysfunction of pelvic region: Secondary | ICD-10-CM | POA: Diagnosis not present

## 2022-10-26 DIAGNOSIS — M9902 Segmental and somatic dysfunction of thoracic region: Secondary | ICD-10-CM | POA: Diagnosis not present

## 2022-10-26 DIAGNOSIS — M9901 Segmental and somatic dysfunction of cervical region: Secondary | ICD-10-CM | POA: Diagnosis not present

## 2022-11-04 DIAGNOSIS — E538 Deficiency of other specified B group vitamins: Secondary | ICD-10-CM | POA: Diagnosis not present

## 2022-11-04 DIAGNOSIS — D510 Vitamin B12 deficiency anemia due to intrinsic factor deficiency: Secondary | ICD-10-CM | POA: Diagnosis not present

## 2022-11-09 DIAGNOSIS — E1169 Type 2 diabetes mellitus with other specified complication: Secondary | ICD-10-CM | POA: Diagnosis not present

## 2022-11-09 DIAGNOSIS — R790 Abnormal level of blood mineral: Secondary | ICD-10-CM | POA: Diagnosis not present

## 2022-11-09 DIAGNOSIS — E785 Hyperlipidemia, unspecified: Secondary | ICD-10-CM | POA: Diagnosis not present

## 2022-11-09 DIAGNOSIS — E1159 Type 2 diabetes mellitus with other circulatory complications: Secondary | ICD-10-CM | POA: Diagnosis not present

## 2022-11-09 DIAGNOSIS — E1165 Type 2 diabetes mellitus with hyperglycemia: Secondary | ICD-10-CM | POA: Diagnosis not present

## 2022-11-09 DIAGNOSIS — M9901 Segmental and somatic dysfunction of cervical region: Secondary | ICD-10-CM | POA: Diagnosis not present

## 2022-11-09 DIAGNOSIS — M9905 Segmental and somatic dysfunction of pelvic region: Secondary | ICD-10-CM | POA: Diagnosis not present

## 2022-11-09 DIAGNOSIS — M9903 Segmental and somatic dysfunction of lumbar region: Secondary | ICD-10-CM | POA: Diagnosis not present

## 2022-11-09 DIAGNOSIS — I152 Hypertension secondary to endocrine disorders: Secondary | ICD-10-CM | POA: Diagnosis not present

## 2022-11-09 DIAGNOSIS — M9904 Segmental and somatic dysfunction of sacral region: Secondary | ICD-10-CM | POA: Diagnosis not present

## 2022-11-16 DIAGNOSIS — D649 Anemia, unspecified: Secondary | ICD-10-CM | POA: Diagnosis not present

## 2022-11-16 DIAGNOSIS — Z1231 Encounter for screening mammogram for malignant neoplasm of breast: Secondary | ICD-10-CM | POA: Diagnosis not present

## 2022-11-16 DIAGNOSIS — E1165 Type 2 diabetes mellitus with hyperglycemia: Secondary | ICD-10-CM | POA: Diagnosis not present

## 2022-11-16 DIAGNOSIS — E1169 Type 2 diabetes mellitus with other specified complication: Secondary | ICD-10-CM | POA: Diagnosis not present

## 2022-11-16 DIAGNOSIS — Z0000 Encounter for general adult medical examination without abnormal findings: Secondary | ICD-10-CM | POA: Diagnosis not present

## 2022-11-16 DIAGNOSIS — R7989 Other specified abnormal findings of blood chemistry: Secondary | ICD-10-CM | POA: Diagnosis not present

## 2022-11-16 DIAGNOSIS — Z1211 Encounter for screening for malignant neoplasm of colon: Secondary | ICD-10-CM | POA: Diagnosis not present

## 2022-11-16 DIAGNOSIS — E785 Hyperlipidemia, unspecified: Secondary | ICD-10-CM | POA: Diagnosis not present

## 2022-11-16 DIAGNOSIS — I152 Hypertension secondary to endocrine disorders: Secondary | ICD-10-CM | POA: Diagnosis not present

## 2022-11-16 DIAGNOSIS — E1159 Type 2 diabetes mellitus with other circulatory complications: Secondary | ICD-10-CM | POA: Diagnosis not present

## 2022-11-16 DIAGNOSIS — G319 Degenerative disease of nervous system, unspecified: Secondary | ICD-10-CM | POA: Diagnosis not present

## 2022-11-16 DIAGNOSIS — E669 Obesity, unspecified: Secondary | ICD-10-CM | POA: Diagnosis not present

## 2022-11-17 ENCOUNTER — Other Ambulatory Visit: Payer: Self-pay | Admitting: Physician Assistant

## 2022-11-17 DIAGNOSIS — Z1231 Encounter for screening mammogram for malignant neoplasm of breast: Secondary | ICD-10-CM

## 2022-11-30 DIAGNOSIS — M9905 Segmental and somatic dysfunction of pelvic region: Secondary | ICD-10-CM | POA: Diagnosis not present

## 2022-11-30 DIAGNOSIS — M9901 Segmental and somatic dysfunction of cervical region: Secondary | ICD-10-CM | POA: Diagnosis not present

## 2022-11-30 DIAGNOSIS — M9903 Segmental and somatic dysfunction of lumbar region: Secondary | ICD-10-CM | POA: Diagnosis not present

## 2022-11-30 DIAGNOSIS — M9904 Segmental and somatic dysfunction of sacral region: Secondary | ICD-10-CM | POA: Diagnosis not present

## 2022-12-01 DIAGNOSIS — H353122 Nonexudative age-related macular degeneration, left eye, intermediate dry stage: Secondary | ICD-10-CM | POA: Diagnosis not present

## 2022-12-01 DIAGNOSIS — H43813 Vitreous degeneration, bilateral: Secondary | ICD-10-CM | POA: Diagnosis not present

## 2022-12-01 DIAGNOSIS — H353212 Exudative age-related macular degeneration, right eye, with inactive choroidal neovascularization: Secondary | ICD-10-CM | POA: Diagnosis not present

## 2022-12-01 DIAGNOSIS — H35373 Puckering of macula, bilateral: Secondary | ICD-10-CM | POA: Diagnosis not present

## 2022-12-01 DIAGNOSIS — H43393 Other vitreous opacities, bilateral: Secondary | ICD-10-CM | POA: Diagnosis not present

## 2022-12-07 DIAGNOSIS — E538 Deficiency of other specified B group vitamins: Secondary | ICD-10-CM | POA: Diagnosis not present

## 2022-12-07 DIAGNOSIS — D510 Vitamin B12 deficiency anemia due to intrinsic factor deficiency: Secondary | ICD-10-CM | POA: Diagnosis not present

## 2022-12-09 DIAGNOSIS — Z794 Long term (current) use of insulin: Secondary | ICD-10-CM | POA: Diagnosis not present

## 2022-12-09 DIAGNOSIS — E1140 Type 2 diabetes mellitus with diabetic neuropathy, unspecified: Secondary | ICD-10-CM | POA: Diagnosis not present

## 2022-12-09 DIAGNOSIS — E1169 Type 2 diabetes mellitus with other specified complication: Secondary | ICD-10-CM | POA: Diagnosis not present

## 2022-12-09 DIAGNOSIS — E785 Hyperlipidemia, unspecified: Secondary | ICD-10-CM | POA: Diagnosis not present

## 2022-12-13 DIAGNOSIS — B351 Tinea unguium: Secondary | ICD-10-CM | POA: Diagnosis not present

## 2022-12-13 DIAGNOSIS — M79675 Pain in left toe(s): Secondary | ICD-10-CM | POA: Diagnosis not present

## 2022-12-13 DIAGNOSIS — L851 Acquired keratosis [keratoderma] palmaris et plantaris: Secondary | ICD-10-CM | POA: Diagnosis not present

## 2022-12-13 DIAGNOSIS — M79674 Pain in right toe(s): Secondary | ICD-10-CM | POA: Diagnosis not present

## 2022-12-13 DIAGNOSIS — E1142 Type 2 diabetes mellitus with diabetic polyneuropathy: Secondary | ICD-10-CM | POA: Diagnosis not present

## 2022-12-14 DIAGNOSIS — M9904 Segmental and somatic dysfunction of sacral region: Secondary | ICD-10-CM | POA: Diagnosis not present

## 2022-12-14 DIAGNOSIS — M9903 Segmental and somatic dysfunction of lumbar region: Secondary | ICD-10-CM | POA: Diagnosis not present

## 2022-12-14 DIAGNOSIS — M9905 Segmental and somatic dysfunction of pelvic region: Secondary | ICD-10-CM | POA: Diagnosis not present

## 2022-12-14 DIAGNOSIS — M9901 Segmental and somatic dysfunction of cervical region: Secondary | ICD-10-CM | POA: Diagnosis not present

## 2022-12-21 DIAGNOSIS — R946 Abnormal results of thyroid function studies: Secondary | ICD-10-CM | POA: Diagnosis not present

## 2022-12-28 DIAGNOSIS — M9901 Segmental and somatic dysfunction of cervical region: Secondary | ICD-10-CM | POA: Diagnosis not present

## 2022-12-28 DIAGNOSIS — M9905 Segmental and somatic dysfunction of pelvic region: Secondary | ICD-10-CM | POA: Diagnosis not present

## 2022-12-28 DIAGNOSIS — M9903 Segmental and somatic dysfunction of lumbar region: Secondary | ICD-10-CM | POA: Diagnosis not present

## 2022-12-28 DIAGNOSIS — M9904 Segmental and somatic dysfunction of sacral region: Secondary | ICD-10-CM | POA: Diagnosis not present

## 2023-01-03 ENCOUNTER — Ambulatory Visit
Admission: RE | Admit: 2023-01-03 | Discharge: 2023-01-03 | Disposition: A | Discharge status: Home / Self Care | Payer: PPO | Source: Ambulatory Visit | Attending: Physician Assistant | Admitting: Physician Assistant

## 2023-01-03 DIAGNOSIS — Z1231 Encounter for screening mammogram for malignant neoplasm of breast: Secondary | ICD-10-CM | POA: Diagnosis not present

## 2023-01-11 DIAGNOSIS — E538 Deficiency of other specified B group vitamins: Secondary | ICD-10-CM | POA: Diagnosis not present

## 2023-01-11 DIAGNOSIS — D510 Vitamin B12 deficiency anemia due to intrinsic factor deficiency: Secondary | ICD-10-CM | POA: Diagnosis not present

## 2023-01-12 DIAGNOSIS — M9903 Segmental and somatic dysfunction of lumbar region: Secondary | ICD-10-CM | POA: Diagnosis not present

## 2023-01-12 DIAGNOSIS — M9904 Segmental and somatic dysfunction of sacral region: Secondary | ICD-10-CM | POA: Diagnosis not present

## 2023-01-12 DIAGNOSIS — M9901 Segmental and somatic dysfunction of cervical region: Secondary | ICD-10-CM | POA: Diagnosis not present

## 2023-01-12 DIAGNOSIS — M9905 Segmental and somatic dysfunction of pelvic region: Secondary | ICD-10-CM | POA: Diagnosis not present

## 2023-01-18 DIAGNOSIS — Z96653 Presence of artificial knee joint, bilateral: Secondary | ICD-10-CM | POA: Diagnosis not present

## 2023-01-20 ENCOUNTER — Ambulatory Visit: Payer: PPO | Admitting: Dermatology

## 2023-01-20 VITALS — BP 124/62 | HR 72

## 2023-01-20 DIAGNOSIS — L821 Other seborrheic keratosis: Secondary | ICD-10-CM | POA: Diagnosis not present

## 2023-01-20 DIAGNOSIS — Z79899 Other long term (current) drug therapy: Secondary | ICD-10-CM

## 2023-01-20 DIAGNOSIS — L304 Erythema intertrigo: Secondary | ICD-10-CM

## 2023-01-20 DIAGNOSIS — L820 Inflamed seborrheic keratosis: Secondary | ICD-10-CM

## 2023-01-20 MED ORDER — KETOCONAZOLE 2 % EX CREA: TOPICAL_CREAM | CUTANEOUS | 2 refills | Status: DC

## 2023-01-20 NOTE — Progress Notes (Signed)
   Follow-Up Visit   Subjective  Laura Mcdaniel is a 77 y.o. female who presents for the following: Skin Problem (The patient has lesions on her arms and back to be evaluated, some may be new or irritating, patient would like to get rid of these lesions.). Refill of Ketoconazole cream for rash in the groin area.  The following portions of the chart were reviewed this encounter and updated as appropriate:   Tobacco  Allergies  Meds  Problems  Med Hx  Surg Hx  Fam Hx     Review of Systems:  No other skin or systemic complaints except as noted in HPI or Assessment and Plan.  Objective  Well appearing patient in no apparent distress; mood and affect are within normal limits.  A focused examination was performed including back,left forearm. Relevant physical exam findings are noted in the Assessment and Plan.  left forearm x 1, back x 16  (17) (17) Stuck-on, waxy, tan-brown papules-- Discussed benign etiology and prognosis.   groin No exam today    Assessment & Plan  Inflamed seborrheic keratosis (17) left forearm x 1, back x 16  (17) Symptomatic, irritating, patient would like treated. Destruction of lesion - left forearm x 1, back x 16  (17) Complexity: simple   Destruction method: cryotherapy   Informed consent: discussed and consent obtained   Timeout:  patient name, date of birth, surgical site, and procedure verified Lesion destroyed using liquid nitrogen: Yes   Region frozen until ice ball extended beyond lesion: Yes   Outcome: patient tolerated procedure well with no complications   Post-procedure details: wound care instructions given    Erythema intertrigo groin Erythema intertrigo -improved but persistent.  Not to goal Exacerbated by irritated seborrheic keratoses  Continue Ketoconazole 2% cream QD PRN flares  Seborrheic Keratoses - Stuck-on, waxy, tan-brown papules and/or plaques  - Benign-appearing - Discussed benign etiology and prognosis. - Observe -  Call for any changes  Return in about 4 months (around 05/21/2023) for ISKs .  IMarye Round, CMA, am acting as scribe for Sarina Ser, MD .  Documentation: I have reviewed the above documentation for accuracy and completeness, and I agree with the above.  Sarina Ser, MD

## 2023-01-20 NOTE — Patient Instructions (Addendum)
Cryotherapy Aftercare  Wash gently with soap and water everyday.   Apply Vaseline and Band-Aid daily until healed.     Due to recent changes in healthcare laws, you may see results of your pathology and/or laboratory studies on MyChart before the doctors have had a chance to review them. We understand that in some cases there may be results that are confusing or concerning to you. Please understand that not all results are received at the same time and often the doctors may need to interpret multiple results in order to provide you with the best plan of care or course of treatment. Therefore, we ask that you please give us 2 business days to thoroughly review all your results before contacting the office for clarification. Should we see a critical lab result, you will be contacted sooner.   If You Need Anything After Your Visit  If you have any questions or concerns for your doctor, please call our main line at [phone] and press option 4 to reach your doctor's medical assistant. If no one answers, please leave a voicemail as directed and we will return your call as soon as possible. Messages left after 4 pm will be answered the following business day.   You may also send us a message via MyChart. We typically respond to MyChart messages within 1-2 business days.  For prescription refills, please ask your pharmacy to contact our office. Our fax number is [phone].  If you have an urgent issue when the clinic is closed that cannot wait until the next business day, you can page your doctor at the number below.    Please note that while we do our best to be available for urgent issues outside of office hours, we are not available 24/7.   If you have an urgent issue and are unable to reach us, you may choose to seek medical care at your doctor's office, retail clinic, urgent care center, or emergency room.  If you have a medical emergency, please immediately call 911 or go to the  emergency department.  Pager Numbers  - Dr. Kowalski: [phone]  - Dr. Moye: [phone]  - Dr. Stewart: [phone]  In the event of inclement weather, please call our main line at [phone] for an update on the status of any delays or closures.  Dermatology Medication Tips: Please keep the boxes that topical medications come in in order to help keep track of the instructions about where and how to use these. Pharmacies typically print the medication instructions only on the boxes and not directly on the medication tubes.   If your medication is too expensive, please contact our office at [phone] option 4 or send us a message through MyChart.   We are unable to tell what your co-pay for medications will be in advance as this is different depending on your insurance coverage. However, we may be able to find a substitute medication at lower cost or fill out paperwork to get insurance to cover a needed medication.   If a prior authorization is required to get your medication covered by your insurance company, please allow us 1-2 business days to complete this process.  Drug prices often vary depending on where the prescription is filled and some pharmacies may offer cheaper prices.  The website www.goodrx.com contains coupons for medications through different pharmacies. The prices here do not account for what the cost may be with help from insurance (it may be cheaper with your insurance), but the website can   give you the price if you did not use any insurance.  - You can print the associated coupon and take it with your prescription to the pharmacy.  - You may also stop by our office during regular business hours and pick up a GoodRx coupon card.  - If you need your prescription sent electronically to a different pharmacy, notify our office through [place] MyChart or by phone at [phone] option 4.     Si Usted Necesita Algo Despus de Su Visita  Tambin puede  enviarnos un mensaje a travs de MyChart. Por lo general respondemos a los mensajes de MyChart en el transcurso de 1 a 2 das hbiles.  Para renovar recetas, por favor pida a su farmacia que se ponga en contacto con nuestra oficina. Nuestro nmero de fax es el [phone].  Si tiene un asunto urgente cuando la clnica est cerrada y que no puede esperar hasta el siguiente da hbil, puede llamar/localizar a su doctor(a) al nmero que aparece a continuacin.   Por favor, tenga en cuenta que aunque hacemos todo lo posible para estar disponibles para asuntos urgentes fuera del horario de oficina, no estamos disponibles las 24 horas del da, los 7 das de la semana.   Si tiene un problema urgente y no puede comunicarse con nosotros, puede optar por buscar atencin mdica  en el consultorio de su doctor(a), en una clnica privada, en un centro de atencin urgente o en una sala de emergencias.  Si tiene una emergencia mdica, por favor llame inmediatamente al 911 o vaya a la sala de emergencias.  Nmeros de bper  - Dr. Kowalski: [phone]  - Dra. Moye: [phone]  - Dra. Stewart: [phone]  En caso de inclemencias del tiempo, por favor llame a nuestra lnea principal al [phone] para una actualizacin sobre el estado de cualquier retraso o cierre.  Consejos para la medicacin en dermatologa: Por favor, guarde las cajas en las que vienen los medicamentos de uso tpico para ayudarle a seguir las instrucciones sobre dnde y cmo usarlos. Las farmacias generalmente imprimen las instrucciones del medicamento slo en las cajas y no directamente en los tubos del medicamento.   Si su medicamento es muy caro, por favor, pngase en contacto con nuestra oficina llamando al [phone] y presione la opcin 4 o envenos un mensaje a travs de MyChart.   No podemos decirle cul ser su copago por los medicamentos por adelantado ya que esto es diferente dependiendo de la cobertura de su seguro.  Sin embargo, es posible que podamos encontrar un medicamento sustituto a menor costo o llenar un formulario para que el seguro cubra el medicamento que se considera necesario.   Si se requiere una autorizacin previa para que su compaa de seguros cubra su medicamento, por favor permtanos de 1 a 2 das hbiles para completar este proceso.  Los precios de los medicamentos varan con frecuencia dependiendo del lugar de dnde se surte la receta y alguna farmacias pueden ofrecer precios ms baratos.  El sitio web www.goodrx.com tiene cupones para medicamentos de diferentes farmacias. Los precios aqu no tienen en cuenta lo que podra costar con la ayuda del seguro (puede ser ms barato con su seguro), pero el sitio web puede darle el precio si no utiliz ningn seguro.  - Puede imprimir el cupn correspondiente y llevarlo con su receta a la farmacia.  - Tambin puede pasar por nuestra oficina durante el horario de atencin regular y recoger una tarjeta de cupones de GoodRx.  -   Si necesita que su receta se enve electrnicamente a una farmacia diferente, informe a nuestra oficina a travs de MyChart de [place] o por telfono llamando al [phone] y presione la opcin 4.  

## 2023-01-26 DIAGNOSIS — M9903 Segmental and somatic dysfunction of lumbar region: Secondary | ICD-10-CM | POA: Diagnosis not present

## 2023-01-26 DIAGNOSIS — M9905 Segmental and somatic dysfunction of pelvic region: Secondary | ICD-10-CM | POA: Diagnosis not present

## 2023-01-26 DIAGNOSIS — M9901 Segmental and somatic dysfunction of cervical region: Secondary | ICD-10-CM | POA: Diagnosis not present

## 2023-01-26 DIAGNOSIS — M9904 Segmental and somatic dysfunction of sacral region: Secondary | ICD-10-CM | POA: Diagnosis not present

## 2023-01-31 ENCOUNTER — Encounter: Payer: Self-pay | Admitting: Dermatology

## 2023-02-09 DIAGNOSIS — M9905 Segmental and somatic dysfunction of pelvic region: Secondary | ICD-10-CM | POA: Diagnosis not present

## 2023-02-09 DIAGNOSIS — M9901 Segmental and somatic dysfunction of cervical region: Secondary | ICD-10-CM | POA: Diagnosis not present

## 2023-02-09 DIAGNOSIS — M9904 Segmental and somatic dysfunction of sacral region: Secondary | ICD-10-CM | POA: Diagnosis not present

## 2023-02-09 DIAGNOSIS — M9903 Segmental and somatic dysfunction of lumbar region: Secondary | ICD-10-CM | POA: Diagnosis not present

## 2023-02-09 DIAGNOSIS — E538 Deficiency of other specified B group vitamins: Secondary | ICD-10-CM | POA: Diagnosis not present

## 2023-03-08 DIAGNOSIS — M9905 Segmental and somatic dysfunction of pelvic region: Secondary | ICD-10-CM | POA: Diagnosis not present

## 2023-03-08 DIAGNOSIS — M9904 Segmental and somatic dysfunction of sacral region: Secondary | ICD-10-CM | POA: Diagnosis not present

## 2023-03-08 DIAGNOSIS — M9901 Segmental and somatic dysfunction of cervical region: Secondary | ICD-10-CM | POA: Diagnosis not present

## 2023-03-08 DIAGNOSIS — M9903 Segmental and somatic dysfunction of lumbar region: Secondary | ICD-10-CM | POA: Diagnosis not present

## 2023-03-15 DIAGNOSIS — D510 Vitamin B12 deficiency anemia due to intrinsic factor deficiency: Secondary | ICD-10-CM | POA: Diagnosis not present

## 2023-03-15 DIAGNOSIS — K219 Gastro-esophageal reflux disease without esophagitis: Secondary | ICD-10-CM | POA: Diagnosis not present

## 2023-03-15 DIAGNOSIS — Z86010 Personal history of colonic polyps: Secondary | ICD-10-CM | POA: Diagnosis not present

## 2023-03-15 DIAGNOSIS — K5901 Slow transit constipation: Secondary | ICD-10-CM | POA: Diagnosis not present

## 2023-03-15 DIAGNOSIS — E538 Deficiency of other specified B group vitamins: Secondary | ICD-10-CM | POA: Diagnosis not present

## 2023-03-15 DIAGNOSIS — R1310 Dysphagia, unspecified: Secondary | ICD-10-CM | POA: Diagnosis not present

## 2023-03-15 DIAGNOSIS — D509 Iron deficiency anemia, unspecified: Secondary | ICD-10-CM | POA: Diagnosis not present

## 2023-03-23 DIAGNOSIS — E1142 Type 2 diabetes mellitus with diabetic polyneuropathy: Secondary | ICD-10-CM | POA: Diagnosis not present

## 2023-03-23 DIAGNOSIS — B351 Tinea unguium: Secondary | ICD-10-CM | POA: Diagnosis not present

## 2023-03-23 DIAGNOSIS — L851 Acquired keratosis [keratoderma] palmaris et plantaris: Secondary | ICD-10-CM | POA: Diagnosis not present

## 2023-04-12 DIAGNOSIS — M9904 Segmental and somatic dysfunction of sacral region: Secondary | ICD-10-CM | POA: Diagnosis not present

## 2023-04-12 DIAGNOSIS — M9905 Segmental and somatic dysfunction of pelvic region: Secondary | ICD-10-CM | POA: Diagnosis not present

## 2023-04-12 DIAGNOSIS — M9901 Segmental and somatic dysfunction of cervical region: Secondary | ICD-10-CM | POA: Diagnosis not present

## 2023-04-12 DIAGNOSIS — M9903 Segmental and somatic dysfunction of lumbar region: Secondary | ICD-10-CM | POA: Diagnosis not present

## 2023-04-18 DIAGNOSIS — E538 Deficiency of other specified B group vitamins: Secondary | ICD-10-CM | POA: Diagnosis not present

## 2023-05-10 DIAGNOSIS — M9905 Segmental and somatic dysfunction of pelvic region: Secondary | ICD-10-CM | POA: Diagnosis not present

## 2023-05-10 DIAGNOSIS — M9901 Segmental and somatic dysfunction of cervical region: Secondary | ICD-10-CM | POA: Diagnosis not present

## 2023-05-10 DIAGNOSIS — M9904 Segmental and somatic dysfunction of sacral region: Secondary | ICD-10-CM | POA: Diagnosis not present

## 2023-05-10 DIAGNOSIS — M9903 Segmental and somatic dysfunction of lumbar region: Secondary | ICD-10-CM | POA: Diagnosis not present

## 2023-05-11 ENCOUNTER — Ambulatory Visit: Payer: PPO | Admitting: Dermatology

## 2023-05-11 VITALS — BP 131/78

## 2023-05-11 DIAGNOSIS — L578 Other skin changes due to chronic exposure to nonionizing radiation: Secondary | ICD-10-CM | POA: Diagnosis not present

## 2023-05-11 DIAGNOSIS — X32XXXA Exposure to sunlight, initial encounter: Secondary | ICD-10-CM

## 2023-05-11 DIAGNOSIS — W908XXA Exposure to other nonionizing radiation, initial encounter: Secondary | ICD-10-CM | POA: Diagnosis not present

## 2023-05-11 DIAGNOSIS — L820 Inflamed seborrheic keratosis: Secondary | ICD-10-CM

## 2023-05-11 DIAGNOSIS — L821 Other seborrheic keratosis: Secondary | ICD-10-CM

## 2023-05-11 NOTE — Patient Instructions (Addendum)
Spironolactone can cause increased urination and cause blood pressure to decrease. Please watch for signs of lightheadedness and be cautious when changing position. It can sometimes cause breast tenderness or an irregular period in premenopausal women. It can also increase potassium. The increase in potassium usually is not a concern unless you are taking other medicines that also increase potassium, so please be sure your doctor knows all of the other medications you are taking. This medication should not be taken by pregnant women.  This medicine should also not be taken together with sulfa drugs like Bactrim (trimethoprim/sulfamethexazole).  Cryotherapy Aftercare  Wash gently with soap and water everyday.   Apply Vaseline and Band-Aid daily until healed.    Due to recent changes in healthcare laws, you may see results of your pathology and/or laboratory studies on MyChart before the doctors have had a chance to review them. We understand that in some cases there may be results that are confusing or concerning to you. Please understand that not all results are received at the same time and often the doctors may need to interpret multiple results in order to provide you with the best plan of care or course of treatment. Therefore, we ask that you please give [place] 2 business days to thoroughly review all your results before contacting the office for clarification. Should we see a critical lab result, you will be contacted sooner.   If You Need Anything After Your Visit  If you have any questions or concerns for your doctor, please call our main line at [phone] and press option 4 to reach your doctor's medical assistant. If no one answers, please leave a voicemail as directed and we will return your call as soon as possible. Messages left after 4 pm will be answered the following business day.   You may also send [place] a message via MyChart. We typically respond to MyChart messages within 1-2 business  days.  For prescription refills, please ask your pharmacy to contact our office. Our fax number is [phone].  If you have an urgent issue when the clinic is closed that cannot wait until the next business day, you can page your doctor at the number below.    Please note that while we do our best to be available for urgent issues outside of office hours, we are not available 24/7.   If you have an urgent issue and are unable to reach [place], you may choose to seek medical care at your doctor's office, retail clinic, urgent care center, or emergency room.  If you have a medical emergency, please immediately call 911 or go to the emergency department.  Pager Numbers  - Dr. Gwen Pounds: [phone]  - Dr. Neale Burly: [phone]  - Dr. Roseanne Reno: [phone]  In the event of inclement weather, please call our main line at [phone] for an update on the status of any delays or closures.  Dermatology Medication Tips: Please keep the boxes that topical medications come in in order to help keep track of the instructions about where and how to use these. Pharmacies typically print the medication instructions only on the boxes and not directly on the medication tubes.   If your medication is too expensive, please contact our office at [phone] option 4 or send [place] a message through MyChart.   We are unable to tell what your co-pay for medications will be in advance as this is different depending on your insurance coverage. However, we may be able to find a substitute medication  at lower cost or fill out paperwork to get insurance to cover a needed medication.   If a prior authorization is required to get your medication covered by your insurance company, please allow [place] 1-2 business days to complete this process.  Drug prices often vary depending on where the prescription is filled and some pharmacies may offer cheaper prices.  The website www.goodrx.com contains coupons for medications through  different pharmacies. The prices here do not account for what the cost may be with help from insurance (it may be cheaper with your insurance), but the website can give you the price if you did not use any insurance.  - You can print the associated coupon and take it with your prescription to the pharmacy.  - You may also stop by our office during regular business hours and pick up a GoodRx coupon card.  - If you need your prescription sent electronically to a different pharmacy, notify our office through [facility] or by phone at [phone] option 4.     Si Usted Necesita Algo Despus de Su Visita  Tambin puede enviarnos un mensaje a travs de Pharmacist, community. Por lo general respondemos a los mensajes de MyChart en el transcurso de 1 a 2 das hbiles.  Para renovar recetas, por favor pida a su farmacia que se ponga en contacto con nuestra oficina. Harland Dingwall de fax es [place] [phone].  Si tiene un asunto urgente cuando la clnica est cerrada y que no puede esperar hasta el siguiente da hbil, puede llamar/localizar a su doctor(a) al nmero que aparece a continuacin.   Por favor, tenga en cuenta que aunque hacemos todo lo posible para estar disponibles para asuntos urgentes fuera del horario de [place], no estamos disponibles las 24 horas del da, los 7 das de la [place].   Si tiene un problema urgente y no puede comunicarse con nosotros, puede optar por buscar atencin mdica  en el consultorio de su doctor(a), en una clnica privada, en un centro de atencin urgente o en una sala de emergencias.  Si tiene Engineering geologist, por favor llame inmediatamente al 911 o vaya a la sala de emergencias.  Nmeros de bper  - Dr. Nehemiah Massed: [phone]  - Dra. Moye: [phone]  - Dra. Nicole Kindred: [phone]  En caso de inclemencias del [place], por favor llame a Johnsie Kindred principal al [phone] para una actualizacin sobre el [place] de cualquier retraso o cierre.  Consejos  para la medicacin en dermatologa: Por favor, guarde las cajas en las que vienen los medicamentos de uso tpico para ayudarle a seguir las instrucciones sobre dnde y cmo usarlos. Las farmacias generalmente imprimen las instrucciones del medicamento slo en las cajas y no directamente en los tubos del [place].   Si su medicamento es muy caro, por favor, pngase en contacto con Zigmund Daniel llamando al [phone] y presione la opcin 4 o envenos un mensaje a travs de Pharmacist, community.   No podemos decirle cul ser su copago por los medicamentos por adelantado ya que esto es diferente dependiendo de la cobertura de su seguro. Sin embargo, es posible que podamos encontrar un medicamento sustituto a Electrical engineer un formulario para que el seguro cubra el medicamento que se considera necesario.   Si se requiere una autorizacin previa para que su compaa de seguros [place] su medicamento, por favor permtanos de 1 a 2 das hbiles para completar este proceso.  Los precios de los medicamentos varan con frecuencia dependiendo del Environmental consultant de dnde  se surte la receta y alguna farmacias pueden ofrecer precios ms baratos.  El sitio web www.goodrx.com tiene cupones para medicamentos de Health and safety inspector. Los precios aqu no tienen en cuenta lo que podra costar con la ayuda del seguro (puede ser ms barato con su seguro), pero el sitio web puede darle el precio si no utiliz Tourist information centre manager.  - Puede imprimir el cupn correspondiente y llevarlo con su receta a la farmacia.  - Tambin puede pasar por nuestra oficina durante el horario de atencin regular y Education officer, museum una tarjeta de cupones de GoodRx.  - Si necesita que su receta se enve electrnicamente a una farmacia diferente, informe a nuestra oficina a travs de MyChart de [place] o por telfono llamando al [phone] y presione la opcin 4.

## 2023-05-11 NOTE — Progress Notes (Signed)
   Follow-Up Visit   Subjective  Laura Mcdaniel is a 77 y.o. female who presents for the following: ISK follow up of back treated with LN2 The patient has spots, moles and lesions to be evaluated, some may be new or changing and the patient may have concern these could be cancer.  The following portions of the chart were reviewed this encounter and updated as appropriate: medications, allergies, medical history  Review of Systems:  No other skin or systemic complaints except as noted in HPI or Assessment and Plan.  Objective  Well appearing patient in no apparent distress; mood and affect are within normal limits. A focused examination was performed of the following areas: Back Relevant exam findings are noted in the Assessment and Plan.  Back (22) Erythematous stuck-on, waxy papule or plaque   Assessment & Plan    Inflamed seborrheic keratosis (22) Back  Symptomatic, irritating, patient would like treated.  Benign-appearing.  Call clinic for new or changing lesions.   Prior to procedure, discussed risks of blister formation, small wound, skin dyspigmentation, or rare scar following treatment. Recommend Vaseline ointment to treated areas while healing.   Destruction of lesion - Back Complexity: simple   Destruction method: cryotherapy   Informed consent: discussed and consent obtained   Timeout:  patient name, date of birth, surgical site, and procedure verified Lesion destroyed using liquid nitrogen: Yes   Region frozen until ice ball extended beyond lesion: Yes   Outcome: patient tolerated procedure well with no complications   Post-procedure details: wound care instructions given    SEBORRHEIC KERATOSIS - Stuck-on, waxy, tan-brown papules and/or plaques  - Benign-appearing - Discussed benign etiology and prognosis. - Observe - Call for any changes  ACTINIC DAMAGE - chronic, secondary to cumulative UV radiation exposure/sun exposure over time - diffuse scaly  erythematous macules with underlying dyspigmentation - Recommend daily broad spectrum sunscreen SPF 30+ to sun-exposed areas, reapply every 2 hours as needed.  - Recommend staying in the shade or wearing long sleeves, sun glasses (UVA+UVB protection) and wide brim hats (4-inch brim around the entire circumference of the hat). - Call for new or changing lesions.  Return in about 4 months (around 09/10/2023) for ISK follow up.  I, Joanie Coddington, CMA, am acting as scribe for Armida Sans, MD .  Documentation: I have reviewed the above documentation for accuracy and completeness, and I agree with the above.  Armida Sans, MD

## 2023-05-13 ENCOUNTER — Encounter: Payer: Self-pay | Admitting: Dermatology

## 2023-05-18 DIAGNOSIS — E1140 Type 2 diabetes mellitus with diabetic neuropathy, unspecified: Secondary | ICD-10-CM | POA: Diagnosis not present

## 2023-05-18 DIAGNOSIS — Z794 Long term (current) use of insulin: Secondary | ICD-10-CM | POA: Diagnosis not present

## 2023-05-18 DIAGNOSIS — E669 Obesity, unspecified: Secondary | ICD-10-CM | POA: Diagnosis not present

## 2023-05-18 DIAGNOSIS — Z0000 Encounter for general adult medical examination without abnormal findings: Secondary | ICD-10-CM | POA: Diagnosis not present

## 2023-05-18 DIAGNOSIS — I10 Essential (primary) hypertension: Secondary | ICD-10-CM | POA: Diagnosis not present

## 2023-05-18 DIAGNOSIS — D649 Anemia, unspecified: Secondary | ICD-10-CM | POA: Diagnosis not present

## 2023-05-18 DIAGNOSIS — Z780 Asymptomatic menopausal state: Secondary | ICD-10-CM | POA: Diagnosis not present

## 2023-05-18 DIAGNOSIS — G63 Polyneuropathy in diseases classified elsewhere: Secondary | ICD-10-CM | POA: Diagnosis not present

## 2023-05-18 DIAGNOSIS — E538 Deficiency of other specified B group vitamins: Secondary | ICD-10-CM | POA: Diagnosis not present

## 2023-05-18 DIAGNOSIS — E782 Mixed hyperlipidemia: Secondary | ICD-10-CM | POA: Diagnosis not present

## 2023-05-19 DIAGNOSIS — M5442 Lumbago with sciatica, left side: Secondary | ICD-10-CM | POA: Diagnosis not present

## 2023-05-19 DIAGNOSIS — M5136 Other intervertebral disc degeneration, lumbar region: Secondary | ICD-10-CM | POA: Diagnosis not present

## 2023-05-19 DIAGNOSIS — M47816 Spondylosis without myelopathy or radiculopathy, lumbar region: Secondary | ICD-10-CM | POA: Diagnosis not present

## 2023-05-19 DIAGNOSIS — M5137 Other intervertebral disc degeneration, lumbosacral region: Secondary | ICD-10-CM | POA: Diagnosis not present

## 2023-05-19 DIAGNOSIS — G8929 Other chronic pain: Secondary | ICD-10-CM | POA: Diagnosis not present

## 2023-05-23 DIAGNOSIS — E538 Deficiency of other specified B group vitamins: Secondary | ICD-10-CM | POA: Diagnosis not present

## 2023-06-01 ENCOUNTER — Other Ambulatory Visit: Payer: Self-pay | Admitting: Sports Medicine

## 2023-06-01 DIAGNOSIS — G8929 Other chronic pain: Secondary | ICD-10-CM

## 2023-06-01 DIAGNOSIS — M5136 Other intervertebral disc degeneration, lumbar region: Secondary | ICD-10-CM

## 2023-06-01 DIAGNOSIS — M47816 Spondylosis without myelopathy or radiculopathy, lumbar region: Secondary | ICD-10-CM

## 2023-06-01 DIAGNOSIS — M5137 Other intervertebral disc degeneration, lumbosacral region: Secondary | ICD-10-CM

## 2023-06-07 DIAGNOSIS — M9903 Segmental and somatic dysfunction of lumbar region: Secondary | ICD-10-CM | POA: Diagnosis not present

## 2023-06-07 DIAGNOSIS — M9905 Segmental and somatic dysfunction of pelvic region: Secondary | ICD-10-CM | POA: Diagnosis not present

## 2023-06-07 DIAGNOSIS — M9904 Segmental and somatic dysfunction of sacral region: Secondary | ICD-10-CM | POA: Diagnosis not present

## 2023-06-07 DIAGNOSIS — M9901 Segmental and somatic dysfunction of cervical region: Secondary | ICD-10-CM | POA: Diagnosis not present

## 2023-06-10 ENCOUNTER — Ambulatory Visit
Admission: RE | Admit: 2023-06-10 | Discharge: 2023-06-10 | Disposition: A | Discharge status: Home / Self Care | Payer: PPO | Source: Ambulatory Visit | Attending: Sports Medicine | Admitting: Sports Medicine

## 2023-06-10 DIAGNOSIS — G8929 Other chronic pain: Secondary | ICD-10-CM | POA: Diagnosis not present

## 2023-06-10 DIAGNOSIS — M5137 Other intervertebral disc degeneration, lumbosacral region: Secondary | ICD-10-CM | POA: Diagnosis not present

## 2023-06-10 DIAGNOSIS — M5136 Other intervertebral disc degeneration, lumbar region: Secondary | ICD-10-CM | POA: Insufficient documentation

## 2023-06-10 DIAGNOSIS — M4807 Spinal stenosis, lumbosacral region: Secondary | ICD-10-CM | POA: Diagnosis not present

## 2023-06-10 DIAGNOSIS — M47816 Spondylosis without myelopathy or radiculopathy, lumbar region: Secondary | ICD-10-CM | POA: Insufficient documentation

## 2023-06-10 DIAGNOSIS — M5126 Other intervertebral disc displacement, lumbar region: Secondary | ICD-10-CM | POA: Diagnosis not present

## 2023-06-10 DIAGNOSIS — M5442 Lumbago with sciatica, left side: Secondary | ICD-10-CM | POA: Diagnosis not present

## 2023-06-10 DIAGNOSIS — M48061 Spinal stenosis, lumbar region without neurogenic claudication: Secondary | ICD-10-CM | POA: Diagnosis not present

## 2023-06-13 DIAGNOSIS — Z794 Long term (current) use of insulin: Secondary | ICD-10-CM | POA: Diagnosis not present

## 2023-06-13 DIAGNOSIS — E1140 Type 2 diabetes mellitus with diabetic neuropathy, unspecified: Secondary | ICD-10-CM | POA: Diagnosis not present

## 2023-06-14 ENCOUNTER — Ambulatory Visit: Payer: PPO | Admitting: Anesthesiology

## 2023-06-14 ENCOUNTER — Ambulatory Visit
Admission: RE | Admit: 2023-06-14 | Discharge: 2023-06-14 | Disposition: A | Discharge status: Home / Self Care | Payer: PPO | Attending: Gastroenterology | Admitting: Gastroenterology

## 2023-06-14 ENCOUNTER — Other Ambulatory Visit: Payer: Self-pay

## 2023-06-14 ENCOUNTER — Encounter
Admission: RE | Disposition: A | Discharge status: Home / Self Care | Payer: Self-pay | Source: Home / Self Care | Attending: Gastroenterology

## 2023-06-14 ENCOUNTER — Encounter: Payer: Self-pay | Admitting: *Deleted

## 2023-06-14 DIAGNOSIS — Z7984 Long term (current) use of oral hypoglycemic drugs: Secondary | ICD-10-CM | POA: Insufficient documentation

## 2023-06-14 DIAGNOSIS — K31A19 Gastric intestinal metaplasia without dysplasia, unspecified site: Secondary | ICD-10-CM | POA: Insufficient documentation

## 2023-06-14 DIAGNOSIS — K640 First degree hemorrhoids: Secondary | ICD-10-CM | POA: Insufficient documentation

## 2023-06-14 DIAGNOSIS — K2970 Gastritis, unspecified, without bleeding: Secondary | ICD-10-CM | POA: Insufficient documentation

## 2023-06-14 DIAGNOSIS — Z09 Encounter for follow-up examination after completed treatment for conditions other than malignant neoplasm: Secondary | ICD-10-CM | POA: Insufficient documentation

## 2023-06-14 DIAGNOSIS — E785 Hyperlipidemia, unspecified: Secondary | ICD-10-CM | POA: Diagnosis not present

## 2023-06-14 DIAGNOSIS — R1310 Dysphagia, unspecified: Secondary | ICD-10-CM | POA: Insufficient documentation

## 2023-06-14 DIAGNOSIS — Z8673 Personal history of transient ischemic attack (TIA), and cerebral infarction without residual deficits: Secondary | ICD-10-CM | POA: Insufficient documentation

## 2023-06-14 DIAGNOSIS — I10 Essential (primary) hypertension: Secondary | ICD-10-CM | POA: Insufficient documentation

## 2023-06-14 DIAGNOSIS — K449 Diaphragmatic hernia without obstruction or gangrene: Secondary | ICD-10-CM | POA: Insufficient documentation

## 2023-06-14 DIAGNOSIS — Z9049 Acquired absence of other specified parts of digestive tract: Secondary | ICD-10-CM | POA: Insufficient documentation

## 2023-06-14 DIAGNOSIS — K2960 Other gastritis without bleeding: Secondary | ICD-10-CM | POA: Diagnosis not present

## 2023-06-14 DIAGNOSIS — Q399 Congenital malformation of esophagus, unspecified: Secondary | ICD-10-CM | POA: Diagnosis not present

## 2023-06-14 DIAGNOSIS — Z1211 Encounter for screening for malignant neoplasm of colon: Secondary | ICD-10-CM | POA: Insufficient documentation

## 2023-06-14 DIAGNOSIS — Z86010 Personal history of colonic polyps: Secondary | ICD-10-CM | POA: Diagnosis not present

## 2023-06-14 DIAGNOSIS — K219 Gastro-esophageal reflux disease without esophagitis: Secondary | ICD-10-CM | POA: Insufficient documentation

## 2023-06-14 DIAGNOSIS — K3189 Other diseases of stomach and duodenum: Secondary | ICD-10-CM | POA: Diagnosis not present

## 2023-06-14 DIAGNOSIS — Z79899 Other long term (current) drug therapy: Secondary | ICD-10-CM | POA: Insufficient documentation

## 2023-06-14 DIAGNOSIS — Z794 Long term (current) use of insulin: Secondary | ICD-10-CM | POA: Diagnosis not present

## 2023-06-14 DIAGNOSIS — Z90710 Acquired absence of both cervix and uterus: Secondary | ICD-10-CM | POA: Diagnosis not present

## 2023-06-14 DIAGNOSIS — E1142 Type 2 diabetes mellitus with diabetic polyneuropathy: Secondary | ICD-10-CM | POA: Diagnosis not present

## 2023-06-14 DIAGNOSIS — K649 Unspecified hemorrhoids: Secondary | ICD-10-CM | POA: Diagnosis not present

## 2023-06-14 HISTORY — PX: COLONOSCOPY WITH PROPOFOL: SHX5780

## 2023-06-14 HISTORY — PX: ESOPHAGOGASTRODUODENOSCOPY: SHX5428

## 2023-06-14 HISTORY — PX: BIOPSY: SHX5522

## 2023-06-14 HISTORY — DX: Cerebral infarction, unspecified: I63.9

## 2023-06-14 LAB — GLUCOSE, CAPILLARY: Glucose-Capillary: 157 mg/dL — ABNORMAL HIGH (ref 70–99)

## 2023-06-14 SURGERY — COLONOSCOPY WITH PROPOFOL
Anesthesia: General

## 2023-06-14 MED ORDER — LIDOCAINE HCL (CARDIAC) PF 100 MG/5ML IV SOSY
PREFILLED_SYRINGE | INTRAVENOUS | Status: DC | PRN
  Administered 2023-06-14: 100 mg via INTRAVENOUS

## 2023-06-14 MED ORDER — PROPOFOL 10 MG/ML IV BOLUS
INTRAVENOUS | Status: DC | PRN
  Administered 2023-06-14 (×2): 30 mg via INTRAVENOUS
  Administered 2023-06-14: 80 mg via INTRAVENOUS

## 2023-06-14 MED ORDER — PROPOFOL 1000 MG/100ML IV EMUL
INTRAVENOUS | Status: AC
  Filled 2023-06-14: qty 100

## 2023-06-14 MED ORDER — GLYCOPYRROLATE 0.2 MG/ML IJ SOLN
INTRAMUSCULAR | Status: DC | PRN
  Administered 2023-06-14: .2 mg via INTRAVENOUS

## 2023-06-14 MED ORDER — SODIUM CHLORIDE 0.9 % IV SOLN: INTRAVENOUS | Status: DC

## 2023-06-14 MED ORDER — PROPOFOL 500 MG/50ML IV EMUL
INTRAVENOUS | Status: DC | PRN
  Administered 2023-06-14: 125 ug/kg/min via INTRAVENOUS

## 2023-06-14 NOTE — Transfer of Care (Signed)
Immediate Anesthesia Transfer of Care Note  Patient: Laura Mcdaniel  Procedure(s) Performed: COLONOSCOPY WITH PROPOFOL ESOPHAGOGASTRODUODENOSCOPY (EGD) BIOPSY  Patient Location: PACU  Anesthesia Type:General  Level of Consciousness: drowsy  Airway & Oxygen Therapy: Patient Spontanous Breathing  Post-op Assessment: Report given to RN and Post -op Vital signs reviewed and stable  Post vital signs: Reviewed and stable  Last Vitals:  Vitals Value Taken Time  BP 143/84 06/14/23 1137  Temp 36.1 C 06/14/23 1136  Pulse 100 06/14/23 1138  Resp 16 06/14/23 1138  SpO2 99 % 06/14/23 1138  Vitals shown include unfiled device data.  Last Pain:  Vitals:   06/14/23 1136  TempSrc: Temporal  PainSc: 0-No pain         Complications: No notable events documented.

## 2023-06-14 NOTE — H&P (Signed)
Outpatient short stay form Pre-procedure 06/14/2023  Regis Bill, MD  Primary Physician: Patrice Paradise, MD  Reason for visit:  Dysphagia/Gerd/History of polyps  History of present illness:    77 y/o lady with history of HLD, hypertension, and DM II here for EGD for GERD/dysphagia and colonoscopy for history of adenomatous polyp on last colonoscopy in 2018. No blood thinners. No family history of GI malignancies. History of appendectomy and hysterectomy.    Current Facility-Administered Medications:    0.9 %  sodium chloride infusion, , Intravenous, Continuous, Keala Drum, Rossie Muskrat, MD, Last Rate: 20 mL/hr at 06/14/23 1012, New Bag at 06/14/23 1012  Medications Prior to Admission  Medication Sig Dispense Refill Last Dose   ASPIRIN EC PO Take 325 mg by mouth at bedtime.   06/13/2023   atorvastatin (LIPITOR) 40 MG tablet Take 40 mg by mouth at bedtime.    Past Week   Calcium Carb-Cholecalciferol (CALCIUM 600+D3 PO) Take 1 tablet by mouth 2 (two) times daily.   Past Week   fluticasone (FLONASE) 50 MCG/ACT nasal spray Place 1-2 sprays into the nose daily as needed. For allergies.   Past Week   gabapentin (NEURONTIN) 300 MG capsule Take 300 mg by mouth as directed. 1 tablet in the morning and 2 tablets in evening   Past Week   hydrochlorothiazide (HYDRODIURIL) 25 MG tablet Take 0.5 tablet (12.5 mg) by mouth once daily   Past Week   insulin glargine (LANTUS SOLOSTAR) 100 UNIT/ML Solostar Pen Inject 15 Units into the skin at bedtime. Sometimes 6 units at night   06/13/2023   ketoconazole (NIZORAL) 2 % cream Apply to affected skin daily 60 g 2 06/13/2023   lisinopril (PRINIVIL,ZESTRIL) 10 MG tablet Take 10 mg by mouth daily.    Past Week   metFORMIN (GLUCOPHAGE) 1000 MG tablet Take 1,000 mg by mouth 2 (two) times daily.   Past Week   Multiple Vitamin (MULTIVITAMIN WITH MINERALS) TABS tablet Take 1 tablet by mouth daily.   Past Week   Multiple Vitamins-Minerals (PRESERVISION AREDS 2 PO)  Take 1 tablet by mouth 2 (two) times daily.   Past Week   pantoprazole (PROTONIX) 40 MG tablet Take 40 mg by mouth 2 (two) times daily.   Past Week   pioglitazone (ACTOS) 15 MG tablet Take 15 mg by mouth at bedtime.   Past Week   vitamin C (ASCORBIC ACID) 500 MG tablet Take 500 mg by mouth daily.   Past Week   vitamin E 400 UNIT capsule Take 400 Units by mouth daily.   Past Week   Aflibercept (EYLEA IO) Inject 1 Syringe into the eye as directed. Per ophthamology Office for macular degeneration      glimepiride (AMARYL) 1 MG tablet Take 1 mg by mouth 2 (two) times daily.       magnesium oxide (MAG-OX) 400 (240 Mg) MG tablet Take 400 mg by mouth daily.      omeprazole (PRILOSEC) 20 MG capsule Take 20 mg by mouth daily before breakfast. as needed (Patient not taking: Reported on 09/17/2022)        Allergies  Allergen Reactions   Mobic [Meloxicam]     Light headed and confusion    Vicodin [Hydrocodone-Acetaminophen] Nausea Only     Past Medical History:  Diagnosis Date   Anemia    B12 deficiency    Carpal tunnel syndrome    Diabetes mellitus without complication (HCC)    Diarrhea    Difficult intubation  DJD (degenerative joint disease)    Enlarged RV (right ventricle)    Enlarged thyroid gland 1970's   GERD (gastroesophageal reflux disease)    Hearing loss    Hiatal hernia    Hypertension    IBS (irritable bowel syndrome)    Macular degeneration    Mixed hyperlipidemia    Peripheral neuropathy    Pre-syncope    a. 04/2022 nl Holter.   Stomatitis    Stroke (HCC)    Type 2 diabetes mellitus (HCC)    Valvular heart disease    a. 08/2018 [facility]: Nl LV fxn. Triv AI, mld MR/TR/PR.    Review of systems:  Otherwise negative.    Physical Exam  Gen: Alert, oriented. Appears stated age.  HEENT: PERRLA. Lungs: No respiratory distress CV: RRR Abd: soft, benign, no masses Ext: No edema    Planned procedures: Proceed with EGD/colonoscopy. The patient understands the nature  of the planned procedure, indications, risks, alternatives and potential complications including but not limited to bleeding, infection, perforation, damage to internal organs and possible oversedation/side effects from anesthesia. The patient agrees and gives consent to proceed.  Please refer to procedure notes for findings, recommendations and patient disposition/instructions.     Regis Bill, MD [facility]astroenterology

## 2023-06-14 NOTE — Op Note (Signed)
[facility] Gastroenterology Patient Name: Laura Mcdaniel Procedure Date: 06/14/2023 10:50 AM MRN: [id number] Account #: [id number] Date of Birth: 06/11/1946 Admit Type: Outpatient Age: 78 Room: [facility] ENDO ROOM 1 Gender: Female Note Status: Finalized Instrument Name: Nelda Marseille [id number] Procedure:             Colonoscopy Indications:           Surveillance: Personal history of adenomatous polyps                         on last colonoscopy 5 years ago Providers:             Eather Colas MD, MD Medicines:             Monitored Anesthesia Care Complications:         No immediate complications. Procedure:             Pre-Anesthesia Assessment:                        - Prior to the procedure, a History and Physical was                         performed, and patient medications and allergies were                         reviewed. The patient is competent. The risks and                         benefits of the procedure and the sedation options and                         risks were discussed with the patient. All questions                         were answered and informed consent was obtained.                         Patient identification and proposed procedure were                         verified by the physician, the nurse, the                         anesthesiologist, the anesthetist and the technician                         in the endoscopy suite. Mental Status Examination:                         alert and oriented. Airway Examination: normal                         oropharyngeal airway and neck mobility. Respiratory                         Examination: clear to auscultation. CV Examination:                         normal. Prophylactic Antibiotics: The patient does not  require prophylactic antibiotics. Prior                         Anticoagulants: The patient has taken no anticoagulant                         or antiplatelet agents. ASA  Grade Assessment: III - A                         patient with severe systemic disease. After reviewing                         the risks and benefits, the patient was deemed in                         satisfactory condition to undergo the procedure. The                         anesthesia plan was to use monitored anesthesia care                         (MAC). Immediately prior to administration of                         medications, the patient was re-assessed for adequacy                         to receive sedatives. The heart rate, respiratory                         rate, oxygen saturations, blood pressure, adequacy of                         pulmonary ventilation, and response to care were                         monitored throughout the procedure. The physical                         status of the patient was re-assessed after the                         procedure.                        After obtaining informed consent, the colonoscope was                         passed under direct vision. Throughout the procedure,                         the patient's blood pressure, pulse, and oxygen                         saturations were monitored continuously. The                         Colonoscope was introduced through the anus and  advanced to the the cecum, identified by appendiceal                         orifice and ileocecal valve. The colonoscopy was                         performed without difficulty. The patient tolerated                         the procedure well. The quality of the bowel                         preparation was adequate to identify polyps. The                         ileocecal valve, appendiceal orifice, and rectum were                         photographed. Findings:      The perianal and digital rectal examinations were normal.      Internal hemorrhoids were found during retroflexion. The hemorrhoids       were Grade I (internal  hemorrhoids that do not prolapse).      The exam was otherwise without abnormality on direct and retroflexion       views. Impression:            - Internal hemorrhoids.                        - The examination was otherwise normal on direct and                         retroflexion views.                        - No specimens collected. Recommendation:        - Discharge patient to home.                        - Resume previous diet.                        - Continue present medications.                        - Repeat colonoscopy is not recommended due to current                         age (76 years or older) for surveillance.                        - Return to referring physician as previously                         scheduled. Procedure Code(s):     --- Professional ---                        Z6109, Colorectal cancer screening; colonoscopy on  individual at high risk Diagnosis Code(s):     --- Professional ---                        Z86.010, Personal history of colonic polyps                        K64.0, First degree hemorrhoids CPT copyright 2022 American Medical Association. All rights reserved. The codes documented in this report are preliminary and upon coder review may  be revised to meet current compliance requirements. Eather Colas MD, MD 06/14/2023 11:40:38 AM Number of Addenda: 0 Note Initiated On: 06/14/2023 10:50 AM Scope Withdrawal Time: 0 hours 11 minutes 9 seconds  Total Procedure Duration: 0 hours 16 minutes 42 seconds  Estimated Blood Loss:  Estimated blood loss: none.      [facility]

## 2023-06-14 NOTE — Interval H&P Note (Signed)
History and Physical Interval Note:  06/14/2023 10:56 AM  Laura Mcdaniel  has presented today for surgery, with the diagnosis of h/o ta polyps GERD.  The various methods of treatment have been discussed with the patient and family. After consideration of risks, benefits and other options for treatment, the patient has consented to  Procedure(s): COLONOSCOPY WITH PROPOFOL (N/A) ESOPHAGOGASTRODUODENOSCOPY (EGD) (N/A) as a surgical intervention.  The patient's history has been reviewed, patient examined, no change in status, stable for surgery.  I have reviewed the patient's chart and labs.  Questions were answered to the patient's satisfaction.     Regis Bill  Ok to proceed with EGD/Colonoscopy

## 2023-06-14 NOTE — Anesthesia Preprocedure Evaluation (Addendum)
Anesthesia Evaluation  Patient identified by MRN, date of birth, ID band Patient awake    Reviewed: Allergy & Precautions, NPO status , Patient's Chart, lab work & pertinent test results  History of Anesthesia Complications (+) DIFFICULT AIRWAY and history of anesthetic complications  Airway Mallampati: IV   Neck ROM: Full    Dental  (+) Missing, Chipped   Pulmonary neg pulmonary ROS   Pulmonary exam normal breath sounds clear to auscultation       Cardiovascular hypertension, Normal cardiovascular exam Rhythm:Regular Rate:Normal  ECG 06/08/22: NSR, low voltage  Myocardial perfusion 05/14/22:    The study is normal. The study is low risk.   No ST deviation was noted.   LV perfusion is normal. There is no evidence of ischemia. There is no evidence of infarction.   Left ventricular function is normal. End diastolic cavity size is normal. End systolic cavity size is normal.   CT attenuation images with mild aortic and coronary calcifications    Neuro/Psych HOH TIA Neuromuscular disease (peripheral neuropathy)    GI/Hepatic hiatal hernia,GERD  ,,  Endo/Other  diabetes, Type 2    Renal/GU      Musculoskeletal  (+) Arthritis ,    Abdominal   Peds  Hematology  (+) Blood dyscrasia, anemia   Anesthesia Other Findings Cardiology note 09/17/22:  Dizziness/Lightheadedness She has persistent daily lightheadedness and dizziness. She tried stopping HCTZ with no change in symptoms. BP is good today. B/L US carotids showed nonobstructive disease. Prio echo showed normal LVEF and prior heart monitor was unremarkable. Orthostatics in the past have been negative. BP is good toda. Continue HCTZ 12.5mg  daily, lisinopril 10mg  daily. No further cardiac work-up at this time.    HTN BP is good today, contiue HCTZ and Lisinopril as above.   HLD LDL 61 in 2022. Continue Lipitor 40mg  daily,    Disposition: Follow up in 1 year(s)  with MD/APP   Reproductive/Obstetrics                             Anesthesia Physical Anesthesia Plan  ASA: 3  Anesthesia Plan: General   Post-op Pain Management:    Induction: Intravenous  PONV Risk Score and Plan: 3 and Propofol infusion, TIVA and Treatment may vary due to age or medical condition  Airway Management Planned: Natural Airway  Additional Equipment:   Intra-op Plan:   Post-operative Plan:   Informed Consent: I have reviewed the patients History and Physical, chart, labs and discussed the procedure including the risks, benefits and alternatives for the proposed anesthesia with the patient or authorized representative who has indicated his/her understanding and acceptance.       Plan Discussed with: CRNA  Anesthesia Plan Comments: (LMA/GETA backup discussed.  Patient consented for risks of anesthesia including but not limited to:  - adverse reactions to medications - damage to eyes, teeth, lips or other oral mucosa - nerve damage due to positioning  - sore throat or hoarseness - damage to heart, brain, nerves, lungs, other parts of body or loss of life  Informed patient about role of CRNA in peri- and intra-operative care.  Patient voiced understanding.)        Anesthesia Quick Evaluation

## 2023-06-14 NOTE — Op Note (Signed)
[facility] Gastroenterology Patient Name: Laura Mcdaniel Procedure Date: 06/14/2023 10:51 AM MRN: [id number] Account #: [id number] Date of Birth: 1946/06/23 Admit Type: Outpatient Age: 77 Room: [facility] ENDO ROOM 1 Gender: Female Note Status: Finalized Instrument Name: Patton Salles Endoscope [id number] Procedure:             Upper GI endoscopy Indications:           Dysphagia, Gastro-esophageal reflux disease Providers:             Eather Colas MD, MD Medicines:             Monitored Anesthesia Care Complications:         No immediate complications. Procedure:             Pre-Anesthesia Assessment:                        - Prior to the procedure, a History and Physical was                         performed, and patient medications and allergies were                         reviewed. The patient is competent. The risks and                         benefits of the procedure and the sedation options and                         risks were discussed with the patient. All questions                         were answered and informed consent was obtained.                         Patient identification and proposed procedure were                         verified by the physician, the nurse, the                         anesthesiologist, the anesthetist and the technician                         in the endoscopy suite. Mental Status Examination:                         alert and oriented. Airway Examination: normal                         oropharyngeal airway and neck mobility. Respiratory                         Examination: clear to auscultation. CV Examination:                         normal. Prophylactic Antibiotics: The patient does not                         require prophylactic antibiotics. Prior  Anticoagulants: The patient has taken no anticoagulant                         or antiplatelet agents. ASA Grade Assessment: III - A                          patient with severe systemic disease. After reviewing                         the risks and benefits, the patient was deemed in                         satisfactory condition to undergo the procedure. The                         anesthesia plan was to use monitored anesthesia care                         (MAC). Immediately prior to administration of                         medications, the patient was re-assessed for adequacy                         to receive sedatives. The heart rate, respiratory                         rate, oxygen saturations, blood pressure, adequacy of                         pulmonary ventilation, and response to care were                         monitored throughout the procedure. The physical                         status of the patient was re-assessed after the                         procedure.                        After obtaining informed consent, the endoscope was                         passed under direct vision. Throughout the procedure,                         the patient's blood pressure, pulse, and oxygen                         saturations were monitored continuously. The Endoscope                         was introduced through the mouth, and advanced to the                         second part of duodenum. The upper GI endoscopy was  accomplished without difficulty. The patient tolerated                         the procedure well. Findings:      A 3 cm hiatal hernia was present.      The lower third of the esophagus was mildly tortuous.      The exam of the esophagus was otherwise normal.      Patchy mild inflammation characterized by erythema was found in the       gastric antrum. Biopsies were taken with a cold forceps for Helicobacter       pylori testing. Estimated blood loss was minimal.      The examined duodenum was normal. Impression:            - 3 cm hiatal hernia.                        - Tortuous esophagus.                         - Gastritis. Biopsied.                        - Normal examined duodenum. Recommendation:        - Discharge patient to home.                        - Resume previous diet.                        - Continue present medications.                        - Await pathology results.                        - Return to referring physician as previously                         scheduled. Procedure Code(s):     --- Professional ---                        [phone], Esophagogastroduodenoscopy, flexible,                         transoral; with biopsy, single or multiple Diagnosis Code(s):     --- Professional ---                        K44.9, Diaphragmatic hernia without obstruction or                         gangrene                        Q39.9, Congenital malformation of esophagus,                         unspecified                        K29.70, Gastritis, unspecified, without bleeding  R13.10, Dysphagia, unspecified                        K21.9, Gastro-esophageal reflux disease without                         esophagitis CPT copyright 2022 American Medical Association. All rights reserved. The codes documented in this report are preliminary and upon coder review may  be revised to meet current compliance requirements. Eather Colas MD, MD 06/14/2023 11:38:33 AM Number of Addenda: 0 Note Initiated On: 06/14/2023 10:51 AM Estimated Blood Loss:  Estimated blood loss: none.      [facility]

## 2023-06-14 NOTE — Anesthesia Postprocedure Evaluation (Signed)
Anesthesia Post Note  Patient: Laura Mcdaniel  Procedure(s) Performed: COLONOSCOPY WITH PROPOFOL ESOPHAGOGASTRODUODENOSCOPY (EGD) BIOPSY  Patient location during evaluation: PACU Anesthesia Type: General Level of consciousness: awake and alert, oriented and patient cooperative Pain management: pain level controlled Vital Signs Assessment: post-procedure vital signs reviewed and stable Respiratory status: spontaneous breathing, nonlabored ventilation and respiratory function stable Cardiovascular status: blood pressure returned to baseline and stable Postop Assessment: adequate PO intake Anesthetic complications: no   No notable events documented.   Last Vitals:  Vitals:   06/14/23 1136 06/14/23 1149  BP: (!) 143/84 (!) 144/83  Pulse:  92  Resp: 15 (!) 21  Temp: (!) 36.1 C   SpO2:  97%    Last Pain:  Vitals:   06/14/23 1146  TempSrc:   PainSc: 0-No pain                 Reed Breech

## 2023-06-15 ENCOUNTER — Encounter: Payer: Self-pay | Admitting: Gastroenterology

## 2023-06-21 DIAGNOSIS — E1159 Type 2 diabetes mellitus with other circulatory complications: Secondary | ICD-10-CM | POA: Diagnosis not present

## 2023-06-21 DIAGNOSIS — H353211 Exudative age-related macular degeneration, right eye, with active choroidal neovascularization: Secondary | ICD-10-CM | POA: Diagnosis not present

## 2023-06-21 DIAGNOSIS — E1140 Type 2 diabetes mellitus with diabetic neuropathy, unspecified: Secondary | ICD-10-CM | POA: Diagnosis not present

## 2023-06-21 DIAGNOSIS — Z794 Long term (current) use of insulin: Secondary | ICD-10-CM | POA: Diagnosis not present

## 2023-06-21 DIAGNOSIS — I152 Hypertension secondary to endocrine disorders: Secondary | ICD-10-CM | POA: Diagnosis not present

## 2023-06-21 DIAGNOSIS — E1169 Type 2 diabetes mellitus with other specified complication: Secondary | ICD-10-CM | POA: Diagnosis not present

## 2023-06-21 DIAGNOSIS — E785 Hyperlipidemia, unspecified: Secondary | ICD-10-CM | POA: Diagnosis not present

## 2023-06-23 DIAGNOSIS — E113393 Type 2 diabetes mellitus with moderate nonproliferative diabetic retinopathy without macular edema, bilateral: Secondary | ICD-10-CM | POA: Diagnosis not present

## 2023-06-27 DIAGNOSIS — E538 Deficiency of other specified B group vitamins: Secondary | ICD-10-CM | POA: Diagnosis not present

## 2023-06-29 DIAGNOSIS — H353122 Nonexudative age-related macular degeneration, left eye, intermediate dry stage: Secondary | ICD-10-CM | POA: Diagnosis not present

## 2023-06-29 DIAGNOSIS — H353211 Exudative age-related macular degeneration, right eye, with active choroidal neovascularization: Secondary | ICD-10-CM | POA: Diagnosis not present

## 2023-07-04 DIAGNOSIS — H902 Conductive hearing loss, unspecified: Secondary | ICD-10-CM | POA: Diagnosis not present

## 2023-07-04 DIAGNOSIS — H6123 Impacted cerumen, bilateral: Secondary | ICD-10-CM | POA: Diagnosis not present

## 2023-07-05 DIAGNOSIS — G8929 Other chronic pain: Secondary | ICD-10-CM | POA: Diagnosis not present

## 2023-07-05 DIAGNOSIS — M545 Low back pain, unspecified: Secondary | ICD-10-CM | POA: Diagnosis not present

## 2023-07-06 DIAGNOSIS — M5416 Radiculopathy, lumbar region: Secondary | ICD-10-CM | POA: Diagnosis not present

## 2023-07-06 DIAGNOSIS — M5442 Lumbago with sciatica, left side: Secondary | ICD-10-CM | POA: Diagnosis not present

## 2023-07-06 DIAGNOSIS — G8929 Other chronic pain: Secondary | ICD-10-CM | POA: Diagnosis not present

## 2023-07-06 DIAGNOSIS — M5441 Lumbago with sciatica, right side: Secondary | ICD-10-CM | POA: Diagnosis not present

## 2023-07-11 DIAGNOSIS — B351 Tinea unguium: Secondary | ICD-10-CM | POA: Diagnosis not present

## 2023-07-11 DIAGNOSIS — E1142 Type 2 diabetes mellitus with diabetic polyneuropathy: Secondary | ICD-10-CM | POA: Diagnosis not present

## 2023-07-11 DIAGNOSIS — L851 Acquired keratosis [keratoderma] palmaris et plantaris: Secondary | ICD-10-CM | POA: Diagnosis not present

## 2023-07-12 DIAGNOSIS — M9904 Segmental and somatic dysfunction of sacral region: Secondary | ICD-10-CM | POA: Diagnosis not present

## 2023-07-12 DIAGNOSIS — M9905 Segmental and somatic dysfunction of pelvic region: Secondary | ICD-10-CM | POA: Diagnosis not present

## 2023-07-12 DIAGNOSIS — G8929 Other chronic pain: Secondary | ICD-10-CM | POA: Diagnosis not present

## 2023-07-12 DIAGNOSIS — M9901 Segmental and somatic dysfunction of cervical region: Secondary | ICD-10-CM | POA: Diagnosis not present

## 2023-07-12 DIAGNOSIS — M545 Low back pain, unspecified: Secondary | ICD-10-CM | POA: Diagnosis not present

## 2023-07-12 DIAGNOSIS — M9903 Segmental and somatic dysfunction of lumbar region: Secondary | ICD-10-CM | POA: Diagnosis not present

## 2023-07-18 DIAGNOSIS — G8929 Other chronic pain: Secondary | ICD-10-CM | POA: Diagnosis not present

## 2023-07-18 DIAGNOSIS — M545 Low back pain, unspecified: Secondary | ICD-10-CM | POA: Diagnosis not present

## 2023-07-25 DIAGNOSIS — M545 Low back pain, unspecified: Secondary | ICD-10-CM | POA: Diagnosis not present

## 2023-07-25 DIAGNOSIS — G8929 Other chronic pain: Secondary | ICD-10-CM | POA: Diagnosis not present

## 2023-07-29 DIAGNOSIS — Z794 Long term (current) use of insulin: Secondary | ICD-10-CM | POA: Diagnosis not present

## 2023-07-29 DIAGNOSIS — E1140 Type 2 diabetes mellitus with diabetic neuropathy, unspecified: Secondary | ICD-10-CM | POA: Diagnosis not present

## 2023-07-29 DIAGNOSIS — M5416 Radiculopathy, lumbar region: Secondary | ICD-10-CM | POA: Diagnosis not present

## 2023-08-02 DIAGNOSIS — E538 Deficiency of other specified B group vitamins: Secondary | ICD-10-CM | POA: Diagnosis not present

## 2023-08-09 DIAGNOSIS — G8929 Other chronic pain: Secondary | ICD-10-CM | POA: Diagnosis not present

## 2023-08-09 DIAGNOSIS — M545 Low back pain, unspecified: Secondary | ICD-10-CM | POA: Diagnosis not present

## 2023-08-09 DIAGNOSIS — M9904 Segmental and somatic dysfunction of sacral region: Secondary | ICD-10-CM | POA: Diagnosis not present

## 2023-08-09 DIAGNOSIS — M9901 Segmental and somatic dysfunction of cervical region: Secondary | ICD-10-CM | POA: Diagnosis not present

## 2023-08-09 DIAGNOSIS — M9903 Segmental and somatic dysfunction of lumbar region: Secondary | ICD-10-CM | POA: Diagnosis not present

## 2023-08-09 DIAGNOSIS — M9905 Segmental and somatic dysfunction of pelvic region: Secondary | ICD-10-CM | POA: Diagnosis not present

## 2023-08-10 DIAGNOSIS — M5442 Lumbago with sciatica, left side: Secondary | ICD-10-CM | POA: Diagnosis not present

## 2023-08-10 DIAGNOSIS — G8929 Other chronic pain: Secondary | ICD-10-CM | POA: Diagnosis not present

## 2023-08-10 DIAGNOSIS — M5441 Lumbago with sciatica, right side: Secondary | ICD-10-CM | POA: Diagnosis not present

## 2023-08-10 DIAGNOSIS — M47816 Spondylosis without myelopathy or radiculopathy, lumbar region: Secondary | ICD-10-CM | POA: Diagnosis not present

## 2023-08-10 DIAGNOSIS — M5416 Radiculopathy, lumbar region: Secondary | ICD-10-CM | POA: Diagnosis not present

## 2023-08-16 ENCOUNTER — Other Ambulatory Visit: Payer: Self-pay | Admitting: Dermatology

## 2023-08-19 DIAGNOSIS — M47816 Spondylosis without myelopathy or radiculopathy, lumbar region: Secondary | ICD-10-CM | POA: Diagnosis not present

## 2023-08-24 DIAGNOSIS — H353122 Nonexudative age-related macular degeneration, left eye, intermediate dry stage: Secondary | ICD-10-CM | POA: Diagnosis not present

## 2023-08-24 DIAGNOSIS — H35373 Puckering of macula, bilateral: Secondary | ICD-10-CM | POA: Diagnosis not present

## 2023-08-24 DIAGNOSIS — H43391 Other vitreous opacities, right eye: Secondary | ICD-10-CM | POA: Diagnosis not present

## 2023-08-24 DIAGNOSIS — H353211 Exudative age-related macular degeneration, right eye, with active choroidal neovascularization: Secondary | ICD-10-CM | POA: Diagnosis not present

## 2023-08-24 DIAGNOSIS — H43811 Vitreous degeneration, right eye: Secondary | ICD-10-CM | POA: Diagnosis not present

## 2023-08-31 DIAGNOSIS — M5442 Lumbago with sciatica, left side: Secondary | ICD-10-CM | POA: Diagnosis not present

## 2023-08-31 DIAGNOSIS — M5441 Lumbago with sciatica, right side: Secondary | ICD-10-CM | POA: Diagnosis not present

## 2023-08-31 DIAGNOSIS — G8929 Other chronic pain: Secondary | ICD-10-CM | POA: Diagnosis not present

## 2023-08-31 DIAGNOSIS — M5416 Radiculopathy, lumbar region: Secondary | ICD-10-CM | POA: Diagnosis not present

## 2023-08-31 DIAGNOSIS — M47816 Spondylosis without myelopathy or radiculopathy, lumbar region: Secondary | ICD-10-CM | POA: Diagnosis not present

## 2023-09-05 DIAGNOSIS — D510 Vitamin B12 deficiency anemia due to intrinsic factor deficiency: Secondary | ICD-10-CM | POA: Diagnosis not present

## 2023-09-05 DIAGNOSIS — Z23 Encounter for immunization: Secondary | ICD-10-CM | POA: Diagnosis not present

## 2023-09-06 DIAGNOSIS — M9903 Segmental and somatic dysfunction of lumbar region: Secondary | ICD-10-CM | POA: Diagnosis not present

## 2023-09-06 DIAGNOSIS — M9904 Segmental and somatic dysfunction of sacral region: Secondary | ICD-10-CM | POA: Diagnosis not present

## 2023-09-06 DIAGNOSIS — M9901 Segmental and somatic dysfunction of cervical region: Secondary | ICD-10-CM | POA: Diagnosis not present

## 2023-09-06 DIAGNOSIS — M9905 Segmental and somatic dysfunction of pelvic region: Secondary | ICD-10-CM | POA: Diagnosis not present

## 2023-09-12 DIAGNOSIS — Z794 Long term (current) use of insulin: Secondary | ICD-10-CM | POA: Diagnosis not present

## 2023-09-12 DIAGNOSIS — M5416 Radiculopathy, lumbar region: Secondary | ICD-10-CM | POA: Diagnosis not present

## 2023-09-12 DIAGNOSIS — E1140 Type 2 diabetes mellitus with diabetic neuropathy, unspecified: Secondary | ICD-10-CM | POA: Diagnosis not present

## 2023-09-14 ENCOUNTER — Ambulatory Visit: Payer: PPO | Admitting: Dermatology

## 2023-09-14 ENCOUNTER — Encounter: Payer: Self-pay | Admitting: Dermatology

## 2023-09-14 DIAGNOSIS — L820 Inflamed seborrheic keratosis: Secondary | ICD-10-CM

## 2023-09-14 DIAGNOSIS — L578 Other skin changes due to chronic exposure to nonionizing radiation: Secondary | ICD-10-CM | POA: Diagnosis not present

## 2023-09-14 DIAGNOSIS — L821 Other seborrheic keratosis: Secondary | ICD-10-CM

## 2023-09-14 DIAGNOSIS — W908XXA Exposure to other nonionizing radiation, initial encounter: Secondary | ICD-10-CM | POA: Diagnosis not present

## 2023-09-14 NOTE — Progress Notes (Signed)
   Follow-Up Visit   Subjective  Laura Mcdaniel is a 77 y.o. female who presents for the following: Spots. Back. Four month follow up. Here to treat more ISKs. Rubbed, irritated by clothing. Itching.   The patient has spots, moles and lesions to be evaluated, some may be new or changing and the patient may have concern these could be cancer.    The following portions of the chart were reviewed this encounter and updated as appropriate: medications, allergies, medical history  Review of Systems:  No other skin or systemic complaints except as noted in HPI or Assessment and Plan.  Objective  Well appearing patient in no apparent distress; mood and affect are within normal limits.  A focused examination was performed of the following areas: Back  Relevant physical exam findings are noted in the Assessment and Plan.  mid to low back x40 (40) Erythematous keratotic or waxy stuck-on papule or plaque.    Assessment & Plan   Inflamed seborrheic keratosis (40) mid to low back x40  Symptomatic, irritating, patient would like treated.  Destruction of lesion - mid to low back x40 (40) Complexity: simple   Destruction method: cryotherapy   Informed consent: discussed and consent obtained   Timeout:  patient name, date of birth, surgical site, and procedure verified Lesion destroyed using liquid nitrogen: Yes   Region frozen until ice ball extended beyond lesion: Yes   Outcome: patient tolerated procedure well with no complications   Post-procedure details: wound care instructions given   Additional details:  Prior to procedure, discussed risks of blister formation, small wound, skin dyspigmentation, or rare scar following cryotherapy. Recommend Vaseline ointment to treated areas while healing.   SEBORRHEIC KERATOSIS - Stuck-on, waxy, tan-brown papules and/or plaques  - Benign-appearing - Discussed benign etiology and prognosis. - Observe - Call for any changes  ACTINIC  DAMAGE - chronic, secondary to cumulative UV radiation exposure/sun exposure over time - diffuse scaly erythematous macules with underlying dyspigmentation - Recommend daily broad spectrum sunscreen SPF 30+ to sun-exposed areas, reapply every 2 hours as needed.  - Recommend staying in the shade or wearing long sleeves, sun glasses (UVA+UVB protection) and wide brim hats (4-inch brim around the entire circumference of the hat). - Call for new or changing lesions.  Return for ISK Follow Up 5-7 months.  I, Lawson Radar, CMA, am acting as scribe for Armida Sans, MD.   Documentation: I have reviewed the above documentation for accuracy and completeness, and I agree with the above.  Armida Sans, MD

## 2023-09-14 NOTE — Patient Instructions (Addendum)
Cryotherapy Aftercare  Wash gently with soap and water everyday.   Apply Vaseline Jelly daily until healed.    Recommend daily broad spectrum sunscreen SPF 30+ to sun-exposed areas, reapply every 2 hours as needed. Call for new or changing lesions.  Staying in the shade or wearing long sleeves, sun glasses (UVA+UVB protection) and wide brim hats (4-inch brim around the entire circumference of the hat) are also recommended for sun protection.    Seborrheic Keratosis  What causes seborrheic keratoses? Seborrheic keratoses are harmless, common skin growths that first appear during adult life.  As time goes by, more growths appear.  Some people may develop a large number of them.  Seborrheic keratoses appear on both covered and uncovered body parts.  They are not caused by sunlight.  The tendency to develop seborrheic keratoses can be inherited.  They vary in color from skin-colored to gray, brown, or even black.  They can be either smooth or have a rough, warty surface.   Seborrheic keratoses are superficial and look as if they were stuck on the skin.  Under the microscope this type of keratosis looks like layers upon layers of skin.  That is why at times the top layer may seem to fall off, but the rest of the growth remains and re-grows.    Treatment Seborrheic keratoses do not need to be treated, but can easily be removed in the office.  Seborrheic keratoses often cause symptoms when they rub on clothing or jewelry.  Lesions can be in the way of shaving.  If they become inflamed, they can cause itching, soreness, or burning.  Removal of a seborrheic keratosis can be accomplished by freezing, burning, or surgery. If any spot bleeds, scabs, or grows rapidly, please return to have it checked, as these can be an indication of a skin cancer.  Due to recent changes in healthcare laws, you may see results of your pathology and/or laboratory studies on MyChart before the doctors have had a chance to  review them. We understand that in some cases there may be results that are confusing or concerning to you. Please understand that not all results are received at the same time and often the doctors may need to interpret multiple results in order to provide you with the best plan of care or course of treatment. Therefore, we ask that you please give [place] 2 business days to thoroughly review all your results before contacting the office for clarification. Should we see a critical lab result, you will be contacted sooner.   If You Need Anything After Your Visit  If you have any questions or concerns for your doctor, please call our main line at [phone] and press option 4 to reach your doctor's medical assistant. If no one answers, please leave a voicemail as directed and we will return your call as soon as possible. Messages left after 4 pm will be answered the following business day.   You may also send [place] a message via MyChart. We typically respond to MyChart messages within 1-2 business days.  For prescription refills, please ask your pharmacy to contact our office. Our fax number is [phone].  If you have an urgent issue when the clinic is closed that cannot wait until the next business day, you can page your doctor at the number below.    Please note that while we do our best to be available for urgent issues outside of office hours, we are not available 24/7.   If  you have an urgent issue and are unable to reach [place], you may choose to seek medical care at your doctor's office, retail clinic, urgent care center, or emergency room.  If you have a medical emergency, please immediately call 911 or go to the emergency department.  Pager Numbers  - Dr. Gwen Pounds: [phone]  - Dr. Roseanne Reno: [phone]  - Dr. Katrinka Blazing: [phone]   In the event of inclement weather, please call our main line at [phone] for an update on the status of any delays or closures.  Dermatology  Medication Tips: Please keep the boxes that topical medications come in in order to help keep track of the instructions about where and how to use these. Pharmacies typically print the medication instructions only on the boxes and not directly on the medication tubes.   If your medication is too expensive, please contact our office at [phone] option 4 or send [place] a message through MyChart.   We are unable to tell what your co-pay for medications will be in advance as this is different depending on your insurance coverage. However, we may be able to find a substitute medication at lower cost or fill out paperwork to get insurance to cover a needed medication.   If a prior authorization is required to get your medication covered by your insurance company, please allow [place] 1-2 business days to complete this process.  Drug prices often vary depending on where the prescription is filled and some pharmacies may offer cheaper prices.  The website www.goodrx.com contains coupons for medications through different pharmacies. The prices here do not account for what the cost may be with help from insurance (it may be cheaper with your insurance), but the website can give you the price if you did not use any insurance.  - You can print the associated coupon and take it with your prescription to the pharmacy.  - You may also stop by our office during regular business hours and pick up a GoodRx coupon card.  - If you need your prescription sent electronically to a different pharmacy, notify our office through [facility] or by phone at [phone] option 4.     Si Usted Necesita Algo Despus de Su Visita  Tambin puede enviarnos un mensaje a travs de Clinical cytogeneticist. Por lo general respondemos a los mensajes de MyChart en el transcurso de 1 a 2 das hbiles.  Para renovar recetas, por favor pida a su farmacia que se ponga en contacto con nuestra oficina. Annie Sable de fax es [place] [phone].  Si  tiene un asunto urgente cuando la clnica est cerrada y que no puede esperar hasta el siguiente da hbil, puede llamar/localizar a su doctor(a) al nmero que aparece a continuacin.   Por favor, tenga en cuenta que aunque hacemos todo lo posible para estar disponibles para asuntos urgentes fuera del horario de [place], no estamos disponibles las 24 horas del da, los 7 [address]  [address] de la [place].   Si tiene un problema urgente y no puede comunicarse con nosotros, puede optar por buscar atencin mdica  en el consultorio de su doctor(a), en una clnica privada, en un centro de atencin urgente o en una sala de emergencias.  Si tiene Engineer, drilling, por favor llame inmediatamente al 911 o vaya a la sala de emergencias.  Nmeros de bper  - Dr. Gwen Pounds: [phone]  - Dra. Roseanne Reno: [phone]  - Dr. Katrinka Blazing: [phone]   En caso de inclemencias del tiempo, por favor llame a nuestra lnea  principal al [phone] para una actualizacin sobre el [place] de cualquier retraso o cierre.  Consejos para la medicacin en dermatologa: Por favor, guarde las cajas en las que vienen los medicamentos de uso tpico para ayudarle a seguir las instrucciones sobre dnde y cmo usarlos. Las farmacias generalmente imprimen las instrucciones del medicamento slo en las cajas y no directamente en los tubos del [place].   Si su medicamento es muy caro, por favor, pngase en contacto con Rolm Gala llamando al [phone] y presione la opcin 4 o envenos un mensaje a travs de Clinical cytogeneticist.   No podemos decirle cul ser su copago por los medicamentos por adelantado ya que esto es diferente dependiendo de la cobertura de su seguro. Sin embargo, es posible que podamos encontrar un medicamento sustituto a Audiological scientist un formulario para que el seguro cubra el medicamento que se considera necesario.   Si se requiere una autorizacin previa para que su compaa de seguros [place] su medicamento, por  favor permtanos de 1 a 2 das hbiles para completar [address]  Los precios de los medicamentos varan con frecuencia dependiendo del Environmental consultant de dnde se surte la receta y alguna farmacias pueden ofrecer precios ms baratos.  El sitio web www.goodrx.com tiene cupones para medicamentos de Health and safety inspector. Los precios aqu no tienen en cuenta lo que podra costar con la ayuda del seguro (puede ser ms barato con su seguro), pero el sitio web puede darle el precio si no utiliz Tourist information centre manager.  - Puede imprimir el cupn correspondiente y llevarlo con su receta a la farmacia.  - Tambin puede pasar por nuestra oficina durante el horario de atencin regular y Education officer, museum una tarjeta de cupones de GoodRx.  - Si necesita que su receta se enve electrnicamente a una farmacia diferente, informe a nuestra oficina a travs de MyChart de [place] o por telfono llamando al [phone] y presione la opcin 4.

## 2023-09-27 DIAGNOSIS — E1140 Type 2 diabetes mellitus with diabetic neuropathy, unspecified: Secondary | ICD-10-CM | POA: Diagnosis not present

## 2023-09-27 DIAGNOSIS — Z794 Long term (current) use of insulin: Secondary | ICD-10-CM | POA: Diagnosis not present

## 2023-09-28 DIAGNOSIS — G8929 Other chronic pain: Secondary | ICD-10-CM | POA: Diagnosis not present

## 2023-09-28 DIAGNOSIS — M5442 Lumbago with sciatica, left side: Secondary | ICD-10-CM | POA: Diagnosis not present

## 2023-09-28 DIAGNOSIS — M5416 Radiculopathy, lumbar region: Secondary | ICD-10-CM | POA: Diagnosis not present

## 2023-09-28 DIAGNOSIS — M5441 Lumbago with sciatica, right side: Secondary | ICD-10-CM | POA: Diagnosis not present

## 2023-10-10 DIAGNOSIS — E538 Deficiency of other specified B group vitamins: Secondary | ICD-10-CM | POA: Diagnosis not present

## 2023-10-10 DIAGNOSIS — H16223 Keratoconjunctivitis sicca, not specified as Sjogren's, bilateral: Secondary | ICD-10-CM | POA: Diagnosis not present

## 2023-10-10 DIAGNOSIS — D510 Vitamin B12 deficiency anemia due to intrinsic factor deficiency: Secondary | ICD-10-CM | POA: Diagnosis not present

## 2023-10-11 DIAGNOSIS — M9903 Segmental and somatic dysfunction of lumbar region: Secondary | ICD-10-CM | POA: Diagnosis not present

## 2023-10-11 DIAGNOSIS — M9904 Segmental and somatic dysfunction of sacral region: Secondary | ICD-10-CM | POA: Diagnosis not present

## 2023-10-11 DIAGNOSIS — M9901 Segmental and somatic dysfunction of cervical region: Secondary | ICD-10-CM | POA: Diagnosis not present

## 2023-10-11 DIAGNOSIS — M9905 Segmental and somatic dysfunction of pelvic region: Secondary | ICD-10-CM | POA: Diagnosis not present

## 2023-10-17 DIAGNOSIS — E1142 Type 2 diabetes mellitus with diabetic polyneuropathy: Secondary | ICD-10-CM | POA: Diagnosis not present

## 2023-10-17 DIAGNOSIS — L603 Nail dystrophy: Secondary | ICD-10-CM | POA: Diagnosis not present

## 2023-10-17 DIAGNOSIS — L851 Acquired keratosis [keratoderma] palmaris et plantaris: Secondary | ICD-10-CM | POA: Diagnosis not present

## 2023-11-02 DIAGNOSIS — H43811 Vitreous degeneration, right eye: Secondary | ICD-10-CM | POA: Diagnosis not present

## 2023-11-02 DIAGNOSIS — H43391 Other vitreous opacities, right eye: Secondary | ICD-10-CM | POA: Diagnosis not present

## 2023-11-02 DIAGNOSIS — H353211 Exudative age-related macular degeneration, right eye, with active choroidal neovascularization: Secondary | ICD-10-CM | POA: Diagnosis not present

## 2023-11-02 DIAGNOSIS — H35373 Puckering of macula, bilateral: Secondary | ICD-10-CM | POA: Diagnosis not present

## 2023-11-02 DIAGNOSIS — H353122 Nonexudative age-related macular degeneration, left eye, intermediate dry stage: Secondary | ICD-10-CM | POA: Diagnosis not present

## 2023-11-08 DIAGNOSIS — M9905 Segmental and somatic dysfunction of pelvic region: Secondary | ICD-10-CM | POA: Diagnosis not present

## 2023-11-08 DIAGNOSIS — M9901 Segmental and somatic dysfunction of cervical region: Secondary | ICD-10-CM | POA: Diagnosis not present

## 2023-11-08 DIAGNOSIS — M9903 Segmental and somatic dysfunction of lumbar region: Secondary | ICD-10-CM | POA: Diagnosis not present

## 2023-11-08 DIAGNOSIS — M9904 Segmental and somatic dysfunction of sacral region: Secondary | ICD-10-CM | POA: Diagnosis not present

## 2023-11-14 DIAGNOSIS — D649 Anemia, unspecified: Secondary | ICD-10-CM | POA: Diagnosis not present

## 2023-11-14 DIAGNOSIS — E538 Deficiency of other specified B group vitamins: Secondary | ICD-10-CM | POA: Diagnosis not present

## 2023-11-14 DIAGNOSIS — I10 Essential (primary) hypertension: Secondary | ICD-10-CM | POA: Diagnosis not present

## 2023-11-14 DIAGNOSIS — E1140 Type 2 diabetes mellitus with diabetic neuropathy, unspecified: Secondary | ICD-10-CM | POA: Diagnosis not present

## 2023-11-14 DIAGNOSIS — D510 Vitamin B12 deficiency anemia due to intrinsic factor deficiency: Secondary | ICD-10-CM | POA: Diagnosis not present

## 2023-11-14 DIAGNOSIS — Z780 Asymptomatic menopausal state: Secondary | ICD-10-CM | POA: Diagnosis not present

## 2023-11-14 DIAGNOSIS — Z794 Long term (current) use of insulin: Secondary | ICD-10-CM | POA: Diagnosis not present

## 2023-11-14 DIAGNOSIS — E782 Mixed hyperlipidemia: Secondary | ICD-10-CM | POA: Diagnosis not present

## 2023-11-21 ENCOUNTER — Other Ambulatory Visit: Payer: Self-pay | Admitting: Physician Assistant

## 2023-11-21 DIAGNOSIS — Z1231 Encounter for screening mammogram for malignant neoplasm of breast: Secondary | ICD-10-CM | POA: Diagnosis not present

## 2023-11-21 DIAGNOSIS — E782 Mixed hyperlipidemia: Secondary | ICD-10-CM | POA: Diagnosis not present

## 2023-11-21 DIAGNOSIS — G63 Polyneuropathy in diseases classified elsewhere: Secondary | ICD-10-CM | POA: Diagnosis not present

## 2023-11-21 DIAGNOSIS — Z794 Long term (current) use of insulin: Secondary | ICD-10-CM | POA: Diagnosis not present

## 2023-11-21 DIAGNOSIS — I10 Essential (primary) hypertension: Secondary | ICD-10-CM | POA: Diagnosis not present

## 2023-11-21 DIAGNOSIS — E1140 Type 2 diabetes mellitus with diabetic neuropathy, unspecified: Secondary | ICD-10-CM | POA: Diagnosis not present

## 2023-11-21 DIAGNOSIS — G319 Degenerative disease of nervous system, unspecified: Secondary | ICD-10-CM | POA: Diagnosis not present

## 2023-11-21 DIAGNOSIS — E66811 Obesity, class 1: Secondary | ICD-10-CM | POA: Diagnosis not present

## 2023-11-21 DIAGNOSIS — B372 Candidiasis of skin and nail: Secondary | ICD-10-CM | POA: Diagnosis not present

## 2023-11-21 DIAGNOSIS — Z0000 Encounter for general adult medical examination without abnormal findings: Secondary | ICD-10-CM | POA: Diagnosis not present

## 2023-12-19 DIAGNOSIS — D510 Vitamin B12 deficiency anemia due to intrinsic factor deficiency: Secondary | ICD-10-CM | POA: Diagnosis not present

## 2024-01-02 DIAGNOSIS — H6123 Impacted cerumen, bilateral: Secondary | ICD-10-CM | POA: Diagnosis not present

## 2024-01-02 DIAGNOSIS — H902 Conductive hearing loss, unspecified: Secondary | ICD-10-CM | POA: Diagnosis not present

## 2024-01-03 DIAGNOSIS — E1140 Type 2 diabetes mellitus with diabetic neuropathy, unspecified: Secondary | ICD-10-CM | POA: Diagnosis not present

## 2024-01-03 DIAGNOSIS — Z794 Long term (current) use of insulin: Secondary | ICD-10-CM | POA: Diagnosis not present

## 2024-01-09 ENCOUNTER — Ambulatory Visit
Admission: RE | Admit: 2024-01-09 | Discharge: 2024-01-09 | Disposition: A | Discharge status: Home / Self Care | Payer: PPO | Source: Ambulatory Visit | Attending: Physician Assistant | Admitting: Physician Assistant

## 2024-01-09 DIAGNOSIS — Z1231 Encounter for screening mammogram for malignant neoplasm of breast: Secondary | ICD-10-CM | POA: Insufficient documentation

## 2024-01-10 DIAGNOSIS — E1159 Type 2 diabetes mellitus with other circulatory complications: Secondary | ICD-10-CM | POA: Diagnosis not present

## 2024-01-10 DIAGNOSIS — Z794 Long term (current) use of insulin: Secondary | ICD-10-CM | POA: Diagnosis not present

## 2024-01-10 DIAGNOSIS — E11649 Type 2 diabetes mellitus with hypoglycemia without coma: Secondary | ICD-10-CM | POA: Diagnosis not present

## 2024-01-10 DIAGNOSIS — E1140 Type 2 diabetes mellitus with diabetic neuropathy, unspecified: Secondary | ICD-10-CM | POA: Diagnosis not present

## 2024-01-10 DIAGNOSIS — I152 Hypertension secondary to endocrine disorders: Secondary | ICD-10-CM | POA: Diagnosis not present

## 2024-01-10 DIAGNOSIS — E162 Hypoglycemia, unspecified: Secondary | ICD-10-CM | POA: Diagnosis not present

## 2024-01-10 DIAGNOSIS — E1169 Type 2 diabetes mellitus with other specified complication: Secondary | ICD-10-CM | POA: Diagnosis not present

## 2024-01-10 DIAGNOSIS — E785 Hyperlipidemia, unspecified: Secondary | ICD-10-CM | POA: Diagnosis not present

## 2024-01-12 DIAGNOSIS — Z96653 Presence of artificial knee joint, bilateral: Secondary | ICD-10-CM | POA: Diagnosis not present

## 2024-01-23 DIAGNOSIS — B351 Tinea unguium: Secondary | ICD-10-CM | POA: Diagnosis not present

## 2024-01-23 DIAGNOSIS — E1142 Type 2 diabetes mellitus with diabetic polyneuropathy: Secondary | ICD-10-CM | POA: Diagnosis not present

## 2024-01-23 DIAGNOSIS — M79675 Pain in left toe(s): Secondary | ICD-10-CM | POA: Diagnosis not present

## 2024-01-23 DIAGNOSIS — M79674 Pain in right toe(s): Secondary | ICD-10-CM | POA: Diagnosis not present

## 2024-01-23 DIAGNOSIS — E538 Deficiency of other specified B group vitamins: Secondary | ICD-10-CM | POA: Diagnosis not present

## 2024-01-23 DIAGNOSIS — D510 Vitamin B12 deficiency anemia due to intrinsic factor deficiency: Secondary | ICD-10-CM | POA: Diagnosis not present

## 2024-01-25 DIAGNOSIS — H353211 Exudative age-related macular degeneration, right eye, with active choroidal neovascularization: Secondary | ICD-10-CM | POA: Diagnosis not present

## 2024-01-25 DIAGNOSIS — H43813 Vitreous degeneration, bilateral: Secondary | ICD-10-CM | POA: Diagnosis not present

## 2024-01-25 DIAGNOSIS — H43393 Other vitreous opacities, bilateral: Secondary | ICD-10-CM | POA: Diagnosis not present

## 2024-01-25 DIAGNOSIS — H353122 Nonexudative age-related macular degeneration, left eye, intermediate dry stage: Secondary | ICD-10-CM | POA: Diagnosis not present

## 2024-01-25 DIAGNOSIS — H35373 Puckering of macula, bilateral: Secondary | ICD-10-CM | POA: Diagnosis not present

## 2024-02-23 DIAGNOSIS — D510 Vitamin B12 deficiency anemia due to intrinsic factor deficiency: Secondary | ICD-10-CM | POA: Diagnosis not present

## 2024-02-23 DIAGNOSIS — E538 Deficiency of other specified B group vitamins: Secondary | ICD-10-CM | POA: Diagnosis not present

## 2024-03-26 DIAGNOSIS — E538 Deficiency of other specified B group vitamins: Secondary | ICD-10-CM | POA: Diagnosis not present

## 2024-03-26 DIAGNOSIS — D510 Vitamin B12 deficiency anemia due to intrinsic factor deficiency: Secondary | ICD-10-CM | POA: Diagnosis not present

## 2024-04-25 ENCOUNTER — Ambulatory Visit: Payer: PPO | Admitting: Dermatology

## 2024-04-25 ENCOUNTER — Encounter: Payer: Self-pay | Admitting: Dermatology

## 2024-04-25 DIAGNOSIS — W908XXA Exposure to other nonionizing radiation, initial encounter: Secondary | ICD-10-CM

## 2024-04-25 DIAGNOSIS — W57XXXA Bitten or stung by nonvenomous insect and other nonvenomous arthropods, initial encounter: Secondary | ICD-10-CM | POA: Diagnosis not present

## 2024-04-25 DIAGNOSIS — L820 Inflamed seborrheic keratosis: Secondary | ICD-10-CM | POA: Diagnosis not present

## 2024-04-25 DIAGNOSIS — S40862A Insect bite (nonvenomous) of left upper arm, initial encounter: Secondary | ICD-10-CM | POA: Diagnosis not present

## 2024-04-25 DIAGNOSIS — L821 Other seborrheic keratosis: Secondary | ICD-10-CM | POA: Diagnosis not present

## 2024-04-25 DIAGNOSIS — L578 Other skin changes due to chronic exposure to nonionizing radiation: Secondary | ICD-10-CM

## 2024-04-25 NOTE — Progress Notes (Signed)
   Follow-Up Visit   Subjective  Laura Mcdaniel is a 78 y.o. female who presents for the following: Spots. Rubbed and irritated by clothing. Hx of ISKs. Here to treat more that are irritated.   Bite on left arm. Dur: 2 weeks. Tx with Cortisone 10.   The patient has spots, moles and lesions to be evaluated, some may be new or changing and the patient may have concern these could be cancer.   The following portions of the chart were reviewed this encounter and updated as appropriate: medications, allergies, medical history  Review of Systems:  No other skin or systemic complaints except as noted in HPI or Assessment and Plan.  Objective  Well appearing patient in no apparent distress; mood and affect are within normal limits.  A focused examination was performed of the following areas: Arms, torso  Relevant physical exam findings are noted in the Assessment and Plan.  Left Upper Arm Pink papules Right flank, right bra line, L forearm x19 (19) Erythematous keratotic or waxy stuck-on papule or plaque.  Assessment & Plan   NONVENOMOUS INSECT BITE OF LEFT UPPER ARM WITHOUT INFECTION, INITIAL ENCOUNTER Left Upper Arm Discussed mometasone cream. Patient deferred Rx treatment at this time. Call if would like Rx  INFLAMED SEBORRHEIC KERATOSIS (19) Right flank, right bra line, L forearm x19 (19) Symptomatic, irritating, patient would like treated. Destruction of lesion - Right flank, right bra line, L forearm x19 (19) Complexity: simple   Destruction method: cryotherapy   Informed consent: discussed and consent obtained   Timeout:  patient name, date of birth, surgical site, and procedure verified Lesion destroyed using liquid nitrogen: Yes   Region frozen until ice ball extended beyond lesion: Yes   Outcome: patient tolerated procedure well with no complications   Post-procedure details: wound care instructions given    SEBORRHEIC KERATOSIS - Stuck-on, waxy, tan-brown papules  and/or plaques  - Benign-appearing - Discussed benign etiology and prognosis. - Observe - Call for any changes  ACTINIC DAMAGE - chronic, secondary to cumulative UV radiation exposure/sun exposure over time - diffuse scaly erythematous macules with underlying dyspigmentation - Recommend daily broad spectrum sunscreen SPF 30+ to sun-exposed areas, reapply every 2 hours as needed.  - Recommend staying in the shade or wearing long sleeves, sun glasses (UVA+UVB protection) and wide brim hats (4-inch brim around the entire circumference of the hat). - Call for new or changing lesions.   Return in about 8 months (around 12/26/2024) for ISK Follow Up.  I, Darcie Easterly, CMA, am acting as scribe for Celine Collard, MD.   Documentation: I have reviewed the above documentation for accuracy and completeness, and I agree with the above.  Celine Collard, MD

## 2024-04-25 NOTE — Patient Instructions (Signed)
Cryotherapy Aftercare  Wash gently with soap and water everyday.   Apply Vaseline Jelly daily until healed.    Recommend daily broad spectrum sunscreen SPF 30+ to sun-exposed areas, reapply every 2 hours as needed. Call for new or changing lesions.  Staying in the shade or wearing long sleeves, sun glasses (UVA+UVB protection) and wide brim hats (4-inch brim around the entire circumference of the hat) are also recommended for sun protection.    Seborrheic Keratosis  What causes seborrheic keratoses? Seborrheic keratoses are harmless, common skin growths that first appear during adult life.  As time goes by, more growths appear.  Some people may develop a large number of them.  Seborrheic keratoses appear on both covered and uncovered body parts.  They are not caused by sunlight.  The tendency to develop seborrheic keratoses can be inherited.  They vary in color from skin-colored to gray, brown, or even black.  They can be either smooth or have a rough, warty surface.   Seborrheic keratoses are superficial and look as if they were stuck on the skin.  Under the microscope this type of keratosis looks like layers upon layers of skin.  That is why at times the top layer may seem to fall off, but the rest of the growth remains and re-grows.    Treatment Seborrheic keratoses do not need to be treated, but can easily be removed in the office.  Seborrheic keratoses often cause symptoms when they rub on clothing or jewelry.  Lesions can be in the way of shaving.  If they become inflamed, they can cause itching, soreness, or burning.  Removal of a seborrheic keratosis can be accomplished by freezing, burning, or surgery. If any spot bleeds, scabs, or grows rapidly, please return to have it checked, as these can be an indication of a skin cancer.  Due to recent changes in healthcare laws, you may see results of your pathology and/or laboratory studies on MyChart before the doctors have had a chance to  review them. We understand that in some cases there may be results that are confusing or concerning to you. Please understand that not all results are received at the same time and often the doctors may need to interpret multiple results in order to provide you with the best plan of care or course of treatment. Therefore, we ask that you please give [place] 2 business days to thoroughly review all your results before contacting the office for clarification. Should we see a critical lab result, you will be contacted sooner.   If You Need Anything After Your Visit  If you have any questions or concerns for your doctor, please call our main line at [phone] and press option 4 to reach your doctor's medical assistant. If no one answers, please leave a voicemail as directed and we will return your call as soon as possible. Messages left after 4 pm will be answered the following business day.   You may also send [place] a message via MyChart. We typically respond to MyChart messages within 1-2 business days.  For prescription refills, please ask your pharmacy to contact our office. Our fax number is [phone].  If you have an urgent issue when the clinic is closed that cannot wait until the next business day, you can page your doctor at the number below.    Please note that while we do our best to be available for urgent issues outside of office hours, we are not available 24/7.   If  you have an urgent issue and are unable to reach [place], you may choose to seek medical care at your doctor's office, retail clinic, urgent care center, or emergency room.  If you have a medical emergency, please immediately call 911 or go to the emergency department.  Pager Numbers  - Dr. Gwen Pounds: [phone]  - Dr. Roseanne Reno: [phone]  - Dr. Katrinka Blazing: [phone]   In the event of inclement weather, please call our main line at [phone] for an update on the status of any delays or closures.  Dermatology  Medication Tips: Please keep the boxes that topical medications come in in order to help keep track of the instructions about where and how to use these. Pharmacies typically print the medication instructions only on the boxes and not directly on the medication tubes.   If your medication is too expensive, please contact our office at [phone] option 4 or send [place] a message through MyChart.   We are unable to tell what your co-pay for medications will be in advance as this is different depending on your insurance coverage. However, we may be able to find a substitute medication at lower cost or fill out paperwork to get insurance to cover a needed medication.   If a prior authorization is required to get your medication covered by your insurance company, please allow [place] 1-2 business days to complete this process.  Drug prices often vary depending on where the prescription is filled and some pharmacies may offer cheaper prices.  The website www.goodrx.com contains coupons for medications through different pharmacies. The prices here do not account for what the cost may be with help from insurance (it may be cheaper with your insurance), but the website can give you the price if you did not use any insurance.  - You can print the associated coupon and take it with your prescription to the pharmacy.  - You may also stop by our office during regular business hours and pick up a GoodRx coupon card.  - If you need your prescription sent electronically to a different pharmacy, notify our office through [facility] or by phone at [phone] option 4.     Si Usted Necesita Algo Despus de Su Visita  Tambin puede enviarnos un mensaje a travs de Clinical cytogeneticist. Por lo general respondemos a los mensajes de MyChart en el transcurso de 1 a 2 das hbiles.  Para renovar recetas, por favor pida a su farmacia que se ponga en contacto con nuestra oficina. Annie Sable de fax es [place] [phone].  Si  tiene un asunto urgente cuando la clnica est cerrada y que no puede esperar hasta el siguiente da hbil, puede llamar/localizar a su doctor(a) al nmero que aparece a continuacin.   Por favor, tenga en cuenta que aunque hacemos todo lo posible para estar disponibles para asuntos urgentes fuera del horario de [place], no estamos disponibles las 24 horas del da, los 7 [address]  [address] de la [place].   Si tiene un problema urgente y no puede comunicarse con nosotros, puede optar por buscar atencin mdica  en el consultorio de su doctor(a), en una clnica privada, en un centro de atencin urgente o en una sala de emergencias.  Si tiene Engineer, drilling, por favor llame inmediatamente al 911 o vaya a la sala de emergencias.  Nmeros de bper  - Dr. Gwen Pounds: [phone]  - Dra. Roseanne Reno: [phone]  - Dr. Katrinka Blazing: [phone]   En caso de inclemencias del tiempo, por favor llame a nuestra lnea  principal al [phone] para una actualizacin sobre el [place] de cualquier retraso o cierre.  Consejos para la medicacin en dermatologa: Por favor, guarde las cajas en las que vienen los medicamentos de uso tpico para ayudarle a seguir las instrucciones sobre dnde y cmo usarlos. Las farmacias generalmente imprimen las instrucciones del medicamento slo en las cajas y no directamente en los tubos del [place].   Si su medicamento es muy caro, por favor, pngase en contacto con Rolm Gala llamando al [phone] y presione la opcin 4 o envenos un mensaje a travs de Clinical cytogeneticist.   No podemos decirle cul ser su copago por los medicamentos por adelantado ya que esto es diferente dependiendo de la cobertura de su seguro. Sin embargo, es posible que podamos encontrar un medicamento sustituto a Audiological scientist un formulario para que el seguro cubra el medicamento que se considera necesario.   Si se requiere una autorizacin previa para que su compaa de seguros [place] su medicamento, por  favor permtanos de 1 a 2 das hbiles para completar [address]  Los precios de los medicamentos varan con frecuencia dependiendo del Environmental consultant de dnde se surte la receta y alguna farmacias pueden ofrecer precios ms baratos.  El sitio web www.goodrx.com tiene cupones para medicamentos de Health and safety inspector. Los precios aqu no tienen en cuenta lo que podra costar con la ayuda del seguro (puede ser ms barato con su seguro), pero el sitio web puede darle el precio si no utiliz Tourist information centre manager.  - Puede imprimir el cupn correspondiente y llevarlo con su receta a la farmacia.  - Tambin puede pasar por nuestra oficina durante el horario de atencin regular y Education officer, museum una tarjeta de cupones de GoodRx.  - Si necesita que su receta se enve electrnicamente a una farmacia diferente, informe a nuestra oficina a travs de MyChart de [place] o por telfono llamando al [phone] y presione la opcin 4.

## 2024-04-30 DIAGNOSIS — B351 Tinea unguium: Secondary | ICD-10-CM | POA: Diagnosis not present

## 2024-04-30 DIAGNOSIS — D510 Vitamin B12 deficiency anemia due to intrinsic factor deficiency: Secondary | ICD-10-CM | POA: Diagnosis not present

## 2024-04-30 DIAGNOSIS — M79675 Pain in left toe(s): Secondary | ICD-10-CM | POA: Diagnosis not present

## 2024-04-30 DIAGNOSIS — M79674 Pain in right toe(s): Secondary | ICD-10-CM | POA: Diagnosis not present

## 2024-04-30 DIAGNOSIS — E538 Deficiency of other specified B group vitamins: Secondary | ICD-10-CM | POA: Diagnosis not present

## 2024-04-30 DIAGNOSIS — E1142 Type 2 diabetes mellitus with diabetic polyneuropathy: Secondary | ICD-10-CM | POA: Diagnosis not present

## 2024-05-15 DIAGNOSIS — Z794 Long term (current) use of insulin: Secondary | ICD-10-CM | POA: Diagnosis not present

## 2024-05-15 DIAGNOSIS — E782 Mixed hyperlipidemia: Secondary | ICD-10-CM | POA: Diagnosis not present

## 2024-05-15 DIAGNOSIS — I10 Essential (primary) hypertension: Secondary | ICD-10-CM | POA: Diagnosis not present

## 2024-05-15 DIAGNOSIS — E1140 Type 2 diabetes mellitus with diabetic neuropathy, unspecified: Secondary | ICD-10-CM | POA: Diagnosis not present

## 2024-05-16 DIAGNOSIS — I152 Hypertension secondary to endocrine disorders: Secondary | ICD-10-CM | POA: Diagnosis not present

## 2024-05-16 DIAGNOSIS — E1159 Type 2 diabetes mellitus with other circulatory complications: Secondary | ICD-10-CM | POA: Diagnosis not present

## 2024-05-16 DIAGNOSIS — E1169 Type 2 diabetes mellitus with other specified complication: Secondary | ICD-10-CM | POA: Diagnosis not present

## 2024-05-16 DIAGNOSIS — E1140 Type 2 diabetes mellitus with diabetic neuropathy, unspecified: Secondary | ICD-10-CM | POA: Diagnosis not present

## 2024-05-16 DIAGNOSIS — E785 Hyperlipidemia, unspecified: Secondary | ICD-10-CM | POA: Diagnosis not present

## 2024-05-16 DIAGNOSIS — Z794 Long term (current) use of insulin: Secondary | ICD-10-CM | POA: Diagnosis not present

## 2024-05-22 DIAGNOSIS — G63 Polyneuropathy in diseases classified elsewhere: Secondary | ICD-10-CM | POA: Diagnosis not present

## 2024-05-22 DIAGNOSIS — Z1331 Encounter for screening for depression: Secondary | ICD-10-CM | POA: Diagnosis not present

## 2024-05-22 DIAGNOSIS — E66811 Obesity, class 1: Secondary | ICD-10-CM | POA: Diagnosis not present

## 2024-05-22 DIAGNOSIS — E782 Mixed hyperlipidemia: Secondary | ICD-10-CM | POA: Diagnosis not present

## 2024-05-22 DIAGNOSIS — E1140 Type 2 diabetes mellitus with diabetic neuropathy, unspecified: Secondary | ICD-10-CM | POA: Diagnosis not present

## 2024-05-22 DIAGNOSIS — Z794 Long term (current) use of insulin: Secondary | ICD-10-CM | POA: Diagnosis not present

## 2024-05-22 DIAGNOSIS — Z0000 Encounter for general adult medical examination without abnormal findings: Secondary | ICD-10-CM | POA: Diagnosis not present

## 2024-05-22 DIAGNOSIS — E538 Deficiency of other specified B group vitamins: Secondary | ICD-10-CM | POA: Diagnosis not present

## 2024-05-22 DIAGNOSIS — I10 Essential (primary) hypertension: Secondary | ICD-10-CM | POA: Diagnosis not present

## 2024-05-29 DIAGNOSIS — H43393 Other vitreous opacities, bilateral: Secondary | ICD-10-CM | POA: Diagnosis not present

## 2024-05-29 DIAGNOSIS — H353211 Exudative age-related macular degeneration, right eye, with active choroidal neovascularization: Secondary | ICD-10-CM | POA: Diagnosis not present

## 2024-05-29 DIAGNOSIS — H353122 Nonexudative age-related macular degeneration, left eye, intermediate dry stage: Secondary | ICD-10-CM | POA: Diagnosis not present

## 2024-05-29 DIAGNOSIS — H43813 Vitreous degeneration, bilateral: Secondary | ICD-10-CM | POA: Diagnosis not present

## 2024-05-29 DIAGNOSIS — H35373 Puckering of macula, bilateral: Secondary | ICD-10-CM | POA: Diagnosis not present

## 2024-06-05 DIAGNOSIS — D510 Vitamin B12 deficiency anemia due to intrinsic factor deficiency: Secondary | ICD-10-CM | POA: Diagnosis not present

## 2024-06-05 DIAGNOSIS — E538 Deficiency of other specified B group vitamins: Secondary | ICD-10-CM | POA: Diagnosis not present

## 2024-06-11 ENCOUNTER — Other Ambulatory Visit: Payer: Self-pay | Admitting: Dermatology

## 2024-06-25 DIAGNOSIS — E113393 Type 2 diabetes mellitus with moderate nonproliferative diabetic retinopathy without macular edema, bilateral: Secondary | ICD-10-CM | POA: Diagnosis not present

## 2024-07-02 DIAGNOSIS — H903 Sensorineural hearing loss, bilateral: Secondary | ICD-10-CM | POA: Diagnosis not present

## 2024-07-02 DIAGNOSIS — H6123 Impacted cerumen, bilateral: Secondary | ICD-10-CM | POA: Diagnosis not present

## 2024-07-10 DIAGNOSIS — E1169 Type 2 diabetes mellitus with other specified complication: Secondary | ICD-10-CM | POA: Diagnosis not present

## 2024-07-10 DIAGNOSIS — E785 Hyperlipidemia, unspecified: Secondary | ICD-10-CM | POA: Diagnosis not present

## 2024-07-10 DIAGNOSIS — Z794 Long term (current) use of insulin: Secondary | ICD-10-CM | POA: Diagnosis not present

## 2024-07-10 DIAGNOSIS — E538 Deficiency of other specified B group vitamins: Secondary | ICD-10-CM | POA: Diagnosis not present

## 2024-07-10 DIAGNOSIS — E1159 Type 2 diabetes mellitus with other circulatory complications: Secondary | ICD-10-CM | POA: Diagnosis not present

## 2024-07-10 DIAGNOSIS — I152 Hypertension secondary to endocrine disorders: Secondary | ICD-10-CM | POA: Diagnosis not present

## 2024-07-10 DIAGNOSIS — D510 Vitamin B12 deficiency anemia due to intrinsic factor deficiency: Secondary | ICD-10-CM | POA: Diagnosis not present

## 2024-07-10 DIAGNOSIS — E1140 Type 2 diabetes mellitus with diabetic neuropathy, unspecified: Secondary | ICD-10-CM | POA: Diagnosis not present

## 2024-08-13 DIAGNOSIS — E538 Deficiency of other specified B group vitamins: Secondary | ICD-10-CM | POA: Diagnosis not present

## 2024-08-13 DIAGNOSIS — D510 Vitamin B12 deficiency anemia due to intrinsic factor deficiency: Secondary | ICD-10-CM | POA: Diagnosis not present

## 2024-08-22 DIAGNOSIS — M79675 Pain in left toe(s): Secondary | ICD-10-CM | POA: Diagnosis not present

## 2024-08-22 DIAGNOSIS — M79674 Pain in right toe(s): Secondary | ICD-10-CM | POA: Diagnosis not present

## 2024-08-22 DIAGNOSIS — B351 Tinea unguium: Secondary | ICD-10-CM | POA: Diagnosis not present

## 2024-09-17 DIAGNOSIS — D510 Vitamin B12 deficiency anemia due to intrinsic factor deficiency: Secondary | ICD-10-CM | POA: Diagnosis not present

## 2024-09-17 DIAGNOSIS — Z23 Encounter for immunization: Secondary | ICD-10-CM | POA: Diagnosis not present

## 2024-10-08 DIAGNOSIS — Z794 Long term (current) use of insulin: Secondary | ICD-10-CM | POA: Diagnosis not present

## 2024-10-08 DIAGNOSIS — E1140 Type 2 diabetes mellitus with diabetic neuropathy, unspecified: Secondary | ICD-10-CM | POA: Diagnosis not present

## 2024-10-15 DIAGNOSIS — I152 Hypertension secondary to endocrine disorders: Secondary | ICD-10-CM | POA: Diagnosis not present

## 2024-10-15 DIAGNOSIS — E1169 Type 2 diabetes mellitus with other specified complication: Secondary | ICD-10-CM | POA: Diagnosis not present

## 2024-10-15 DIAGNOSIS — E1140 Type 2 diabetes mellitus with diabetic neuropathy, unspecified: Secondary | ICD-10-CM | POA: Diagnosis not present

## 2024-10-15 DIAGNOSIS — E11649 Type 2 diabetes mellitus with hypoglycemia without coma: Secondary | ICD-10-CM | POA: Diagnosis not present

## 2024-10-15 DIAGNOSIS — E785 Hyperlipidemia, unspecified: Secondary | ICD-10-CM | POA: Diagnosis not present

## 2024-10-15 DIAGNOSIS — E1159 Type 2 diabetes mellitus with other circulatory complications: Secondary | ICD-10-CM | POA: Diagnosis not present

## 2024-10-15 DIAGNOSIS — Z794 Long term (current) use of insulin: Secondary | ICD-10-CM | POA: Diagnosis not present

## 2024-10-22 DIAGNOSIS — D510 Vitamin B12 deficiency anemia due to intrinsic factor deficiency: Secondary | ICD-10-CM | POA: Diagnosis not present

## 2024-10-22 DIAGNOSIS — E538 Deficiency of other specified B group vitamins: Secondary | ICD-10-CM | POA: Diagnosis not present

## 2024-12-10 ENCOUNTER — Other Ambulatory Visit: Payer: Self-pay | Admitting: Physician Assistant

## 2024-12-10 DIAGNOSIS — Z1231 Encounter for screening mammogram for malignant neoplasm of breast: Secondary | ICD-10-CM

## 2025-01-09 ENCOUNTER — Encounter

## 2025-01-15 ENCOUNTER — Ambulatory Visit: Admitting: Dermatology
